# Patient Record
Sex: Female | Born: 1935 | Race: White | Hispanic: No | Marital: Married | Smoking: Former smoker
Health system: Southern US, Community
[De-identification: ages and names within clinical notes are randomized; demographics above are authoritative.]

## PROBLEM LIST (undated history)

## (undated) DIAGNOSIS — E049 Nontoxic goiter, unspecified: Secondary | ICD-10-CM

## (undated) DIAGNOSIS — J45909 Unspecified asthma, uncomplicated: Secondary | ICD-10-CM

## (undated) DIAGNOSIS — N3010 Interstitial cystitis (chronic) without hematuria: Secondary | ICD-10-CM

## (undated) DIAGNOSIS — M1990 Unspecified osteoarthritis, unspecified site: Secondary | ICD-10-CM

## (undated) DIAGNOSIS — D329 Benign neoplasm of meninges, unspecified: Secondary | ICD-10-CM

## (undated) DIAGNOSIS — M549 Dorsalgia, unspecified: Secondary | ICD-10-CM

## (undated) DIAGNOSIS — K589 Irritable bowel syndrome without diarrhea: Secondary | ICD-10-CM

## (undated) DIAGNOSIS — B029 Zoster without complications: Secondary | ICD-10-CM

## (undated) DIAGNOSIS — I10 Essential (primary) hypertension: Secondary | ICD-10-CM

## (undated) DIAGNOSIS — L900 Lichen sclerosus et atrophicus: Secondary | ICD-10-CM

## (undated) DIAGNOSIS — E785 Hyperlipidemia, unspecified: Secondary | ICD-10-CM

## (undated) DIAGNOSIS — E039 Hypothyroidism, unspecified: Secondary | ICD-10-CM

## (undated) DIAGNOSIS — R0789 Other chest pain: Secondary | ICD-10-CM

## (undated) DIAGNOSIS — I493 Ventricular premature depolarization: Secondary | ICD-10-CM

## (undated) DIAGNOSIS — M109 Gout, unspecified: Secondary | ICD-10-CM

## (undated) HISTORY — DX: Lichen sclerosus et atrophicus: L90.0

---
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
## 1988-07-26 HISTORY — PX: FINGER SURGERY: SHX640

## 1998-07-29 ENCOUNTER — Other Ambulatory Visit: Admission: RE | Admit: 1998-07-29 | Discharge: 1998-07-29 | Payer: Self-pay | Admitting: Obstetrics and Gynecology

## 1999-08-25 ENCOUNTER — Other Ambulatory Visit: Admission: RE | Admit: 1999-08-25 | Discharge: 1999-08-25 | Payer: Self-pay | Admitting: Obstetrics and Gynecology

## 2000-06-15 ENCOUNTER — Encounter (INDEPENDENT_AMBULATORY_CARE_PROVIDER_SITE_OTHER): Payer: Self-pay

## 2000-06-15 ENCOUNTER — Other Ambulatory Visit: Admission: RE | Admit: 2000-06-15 | Discharge: 2000-06-15 | Payer: Self-pay | Admitting: Obstetrics and Gynecology

## 2000-10-20 ENCOUNTER — Other Ambulatory Visit: Admission: RE | Admit: 2000-10-20 | Discharge: 2000-10-20 | Payer: Self-pay | Admitting: Obstetrics and Gynecology

## 2001-07-05 ENCOUNTER — Encounter (INDEPENDENT_AMBULATORY_CARE_PROVIDER_SITE_OTHER): Payer: Self-pay | Admitting: *Deleted

## 2001-07-05 ENCOUNTER — Ambulatory Visit (HOSPITAL_COMMUNITY): Admission: RE | Admit: 2001-07-05 | Discharge: 2001-07-05 | Payer: Self-pay | Admitting: Gastroenterology

## 2001-07-26 HISTORY — PX: CHOLECYSTECTOMY: SHX55

## 2001-08-28 ENCOUNTER — Encounter: Payer: Self-pay | Admitting: Gastroenterology

## 2001-08-28 ENCOUNTER — Ambulatory Visit (HOSPITAL_COMMUNITY): Admission: RE | Admit: 2001-08-28 | Discharge: 2001-08-28 | Payer: Self-pay | Admitting: Gastroenterology

## 2001-10-17 ENCOUNTER — Inpatient Hospital Stay (HOSPITAL_COMMUNITY): Admission: EM | Admit: 2001-10-17 | Discharge: 2001-10-19 | Payer: Self-pay | Admitting: Emergency Medicine

## 2001-10-17 ENCOUNTER — Encounter: Payer: Self-pay | Admitting: General Surgery

## 2001-10-17 ENCOUNTER — Encounter (INDEPENDENT_AMBULATORY_CARE_PROVIDER_SITE_OTHER): Payer: Self-pay | Admitting: *Deleted

## 2001-11-06 ENCOUNTER — Inpatient Hospital Stay (HOSPITAL_COMMUNITY): Admission: AD | Admit: 2001-11-06 | Discharge: 2001-11-16 | Payer: Self-pay | Admitting: General Surgery

## 2001-11-06 ENCOUNTER — Encounter: Payer: Self-pay | Admitting: Gastroenterology

## 2001-11-06 ENCOUNTER — Encounter: Admission: RE | Admit: 2001-11-06 | Discharge: 2001-11-06 | Payer: Self-pay | Admitting: Gastroenterology

## 2001-11-07 ENCOUNTER — Encounter: Payer: Self-pay | Admitting: General Surgery

## 2001-11-10 ENCOUNTER — Encounter: Payer: Self-pay | Admitting: General Surgery

## 2001-11-13 ENCOUNTER — Encounter: Payer: Self-pay | Admitting: General Surgery

## 2001-11-14 ENCOUNTER — Encounter: Payer: Self-pay | Admitting: General Surgery

## 2002-04-24 ENCOUNTER — Other Ambulatory Visit: Admission: RE | Admit: 2002-04-24 | Discharge: 2002-04-24 | Payer: Self-pay | Admitting: Obstetrics and Gynecology

## 2002-05-04 ENCOUNTER — Ambulatory Visit (HOSPITAL_COMMUNITY): Admission: RE | Admit: 2002-05-04 | Discharge: 2002-05-04 | Payer: Self-pay | Admitting: Obstetrics and Gynecology

## 2002-05-04 ENCOUNTER — Encounter (INDEPENDENT_AMBULATORY_CARE_PROVIDER_SITE_OTHER): Payer: Self-pay | Admitting: *Deleted

## 2002-07-26 HISTORY — PX: FOOT SURGERY: SHX648

## 2003-05-27 ENCOUNTER — Other Ambulatory Visit: Admission: RE | Admit: 2003-05-27 | Discharge: 2003-05-27 | Payer: Self-pay | Admitting: Obstetrics & Gynecology

## 2007-01-12 ENCOUNTER — Ambulatory Visit (HOSPITAL_COMMUNITY): Admission: RE | Admit: 2007-01-12 | Discharge: 2007-01-12 | Payer: Self-pay | Admitting: Cardiology

## 2007-01-25 ENCOUNTER — Ambulatory Visit (HOSPITAL_COMMUNITY): Admission: RE | Admit: 2007-01-25 | Discharge: 2007-01-25 | Payer: Self-pay | Admitting: Cardiology

## 2007-09-06 ENCOUNTER — Encounter: Admission: RE | Admit: 2007-09-06 | Discharge: 2007-09-06 | Payer: Self-pay | Admitting: Otolaryngology

## 2008-01-31 ENCOUNTER — Encounter: Admission: RE | Admit: 2008-01-31 | Discharge: 2008-01-31 | Payer: Self-pay | Admitting: Family Medicine

## 2008-05-16 ENCOUNTER — Encounter: Admission: RE | Admit: 2008-05-16 | Discharge: 2008-05-16 | Payer: Self-pay | Admitting: Neurosurgery

## 2010-08-16 ENCOUNTER — Encounter: Payer: Self-pay | Admitting: Interventional Cardiology

## 2010-12-08 NOTE — Cardiovascular Report (Signed)
Carla [name], Carla [name]            ACCOUNT NO.:  [id number]   MEDICAL RECORD NO.:  [id number]          PATIENT TYPE:  OIB   LOCATION:  2899                         FACILITY:  [facility]   PHYSICIAN:  Armanda Magic, M.D.     DATE OF BIRTH:  1935/12/12   DATE OF PROCEDURE:  01/25/2007  DATE OF DISCHARGE:  01/25/2007                            CARDIAC CATHETERIZATION   PROCEDURE:  Left heart catheterization, coronary angiography, left  ventriculography.   OPERATOR:  Armanda Magic, M.D.   INDICATIONS:  Chest pain and abnormal Cardiolite.   COMPLICATIONS:  None.   INTRAVENOUS ACCESS:  Via right femoral artery 6-French sheath.   This is a 75 year old white female with a history of atypical chest pain  who presented for stress Cardiolite study which was unremarkable, but  because of the patient's continued chest pain, we proceeded with a  cardiac CT.  The cardiac CT showed heavy calcification in the mid LAD,  and she now presents for cardiac catheterization.   The patient was brought to cardiac catheterization laboratory in the  fasting, nonsedated state.  Informed consent was obtained.  The patient  was connected to continuous heart rate and pulse oximetry monitoring and  intermittent blood pressure monitoring.  The right groin was prepped and  draped in a sterile fashion.  One percent Xylocaine was used for local  anesthesia.  Using modified Seldinger technique, a 6-French sheath was  placed in the right femoral artery.  Under fluoroscopic guidance, a 6-  [place] JL-4 catheter was placed in left coronary artery.  Multiple cine  films were taken in the 30-degree RAO, 40-degree LAO views.  This  catheter was then exchanged out over a guidewire for a 6-French JR-4  catheter which was placed under fluoroscopic guidance in the area of the  right coronary artery, but I could not cannulate the right coronary  artery.  The catheter was exchanged out over a guidewire for a 6-French  Noto right  coronary artery catheter which again could not engage the  right coronary ostium.  The catheter was exchanged out over a guidewire  for a 6-French angled pigtail catheter which was placed under  fluoroscopic guidance in the left ventricular cavity.  Left  ventriculography was performed in the 30-degree RAO view using a total  of 30 mL of contrast at 15 mL per second.  The catheter was then pulled  back across the aortic valve with no significant gradient noted.  Catheter was then exchanged out over a guidewire for a 6-French AL-2  catheter.  Because of what appeared to be an aberrant takeoff of the  right coronary artery, again it could not be successfully engaged.  My  colleague, Dr. Eldridge Dace, then stepped in and was able to engage the  right coronary ostium which did truly takeoff from the right coronary  cusp.  Multiple cine films were taken in the 30-degree RAO, 40-degree  LAO views.  The catheter was removed over a guidewire.  At the end of  the procedure, all catheters and sheaths were removed.  Manual  compression was performed until adequate hemostasis was obtained.  The  patient was transferred back to the room in stable condition.   RESULTS:  The left main coronary artery is widely patent and bifurcates  in the left anterior descending artery and left circumflex artery.  The  left anterior descending artery is calcified in its mid to proximal  portion.  It gives rise to a first diagonal branch.  It then gives rise  to a second diagonal branch.  Just after the take off the second  diagonal branch, there was some luminal irregularities less than 10% in  the ongoing LAD.  The first diagonal is widely patent and bifurcates  into 2 daughter blood vessels.  The second diagonal is also widely  patent but small.   The left circumflex is widely patent throughout its course in the AV  groove, giving rise to a first obtuse marginal branch which is widely  patent.  It then gives rise to a  second obtuse marginal branch which is  widely patent and bifurcates into 2 daughter branches.  The third then  terminates in a third obtuse marginal branch.   The right coronary artery is widely patent throughout its course  bifurcating distally in a posterior descending artery and posterior  lateral artery, both of which are widely patent.   Left ventriculography shows normal LV systolic function, EF 60%, left  ventricular pressure 140/3 mmHg, aortic pressure 129/57 mmHg.   ASSISTANT:  1. Normal coronary arteries.  2. Normal LV function.  3. Noncardiac chest pain.   PLAN:  Discharge to home after IV fluid bedrest.  Follow up with  physician assistant in 2 weeks for groin check.  Continue aspirin 325 mg  a day because of calcification of the left coronary artery, although  there is no significant obstructive disease.      Armanda Magic, M.D.  Electronically Signed     TT/MEDQ  D:  01/26/2007  T:  01/27/2007  Job:  [id number]   cc:   Chales Salmon. Abigail Miyamoto, M.D.

## 2010-12-11 NOTE — Discharge Summary (Signed)
[place]. [facility]  Patient:    Carla [name], Carla [name] Visit Number: [id number] MRN: [id number]          Service Type: EMS Location: [place] Attending Physician:  Lorre Nick Dictated by:   Adolph Pollack, M.D. Admit Date:  11/06/2001 Discharge Date: 11/06/2001   CC:         Anselmo Rod, M.D.  Chales Salmon. Abigail Miyamoto, M.D.   Discharge Summary  DISCHARGE DIAGNOSES:  1. Right upper quadrant subhepatic abscess (Staphylococcus aureus).  2. Severe acute on chronic gastritis.  3. Hypertension.  4. Hyperlipidemia.  5. Hypothyroidism.  6. Nephrolithiasis.  7. Chronic interstitial cystitis.  8. Mild hypokalemia.  9. Constipation. 10. Gastroesophageal reflux disease.  REASON FOR ADMISSION:  Carla [name] is a 75 year old female who underwent a laparoscopic cholecystectomy October 18, 2001, for acute cholecystitis.  She remained in the hospital on IV antibiotics for 24 hours.  A drainage catheter remained in as well.  She subsequently was discharged from the hospital.  She presented back to my office the day before her followup with me on April 1, and Dr. Orson Slick removed the drain.  Approximately five days later, she began having vomiting.  There was no abdominal pain, no fevers or chills.  She continued to have vomiting for approximately one week.  She presented to Dr. Blanca Friend office.  Dr. Elsie Amis ordered a CT scan which demonstrated a subhepatic fluid collection as well as a massively enlarged stomach with findings suggestive of gastric outlet obstruction.  She subsequently was admitted.  HOSPITAL COURSE:  She was admitted and started on IV fluids for IV fluid hydration.  White blood cell count was 10,300, which was within normal range. No elevation of liver function tests were noted and amylase and lipase were noted.  We were unable to pass a NG tube.  Subsequently, Dr. Elsie Amis performed upper endoscopy to decompress the stomach.  She noted a  dilated stomach with diffuse gastritis worse in the antrum and pyloric area.  She subsequently had to undergo CT-guided percutaneous drainage of the right upper quadrant abscess which ended up growing out methicillin-sensitive Staphylococcus aureus.  She was started on IV antibiotics at that time. Initially, she was on IV Unasyn and was switched over to IV vancomycin.  White count remained normal.  She had a mild hypokalemia which was supplemented.  I switched her over to vancomycin when we learned it was Staphylococcus aureus. She started having some fevers, but the fever slowly declined as IV Protonix was used to treat her severe gastritis.  The right upper quadrant drain continued to drain purulent material, but then the drainage started to decline.  She underwent a repeat CT scan on November 13, 2001, to reevaluate the abscess cavity and the need for drain.  There is still some inflammatory change seen.  The next day, she had removal of the drain and the catheter was collapsed.  The following day, she had the catheter removed and was switched over to oral clindamycin.  She was ambulatory now and had been switched over to a more solid type diet and was tolerating this.  Dr. Loreta Ave came by and gave her very specific instructions with respect to a diet for her reflux disease and gastritis as well as instructions for her chronic constipation.  Dr. Loreta Ave wrote these out and gave her a copy which she signed and also included a copy with the chart.  On November 16, 2001, she had been afebrile for  over 24 hours.  Her abdomen was soft.  She did have a complaint of what she felt was like a blood blister on the right breast.  When I examined this, there is a dark, reddish area measuring 8-10 mm consistent with either a blood blister or a small hemangioma.  No solid mass with noted. She is tolerating her clindamycin.  I felt that she could be discharged.  DISPOSITION:  Discharged to home on November 16, 2001.  DISCHARGE MEDICATIONS: 1. Clindamycin 150 mg p.o. t.i.d. 2. Home medications.  She has been given specific instructions by Dr. Elsie Amis    and myself.  ACTIVITY:  She was told to walk as much as possible, but not to over exert herself.  SPECIAL INSTRUCTIONS:  She was told if she had high fevers or started feeling poorly again, to call the office.  I told her about the specific side effect of diarrhea and possible C. difficile colitis with clindamycin and she knows to call me if she starts having problems with that.  FOLLOWUP:  I have arranged for her to come back and see me in the office on March 30, at 9 a.m.  CONDITION ON DISCHARGE:  She was discharged in satisfactory condition. Dictated by:   Adolph Pollack, M.D. Attending Physician:  Lorre Nick DD:  11/16/01 TD:  11/16/01 Job: [id number]

## 2010-12-11 NOTE — H&P (Signed)
[place]. [facility]  Patient:    Carla [name], Carla [name] Visit Number: [id number] MRN: [id number]          Service Type: MED Location: 1800 1827 01 Attending Physician:  Lorre Nick Dictated by:   Adolph Pollack, M.D. Admit Date:  10/17/2001                           History and Physical  REASON FOR ADMISSION:  Acute right upper quadrant pain, nausea and vomiting.  HISTORY OF PRESENT ILLNESS:  Carla [name] is a 75 year old female who awoke this morning with what she described as epigastric and right upper quadrant pain that radiated around to the right flank; she also has had protracted nausea and vomiting.  She presented to Dr. Queen Blossom office acutely ill and was sent to the emergency department for me to evaluate.  She denied fever, chills or jaundice.  She does have known cholelithiasis and I had seen her in the office, however, her symptomatology at that time was consistent with gastroesophageal reflux disease, as her symptoms were controlled with Prevacid.  She states this pain is different than her heartburn pain.  PAST MEDICAL HISTORY: 1. Hypertension. 2. Hyperlipidemia. 3. Hypothyroidism. 4. Gastroesophageal reflux disease. 5. Chronic interstitial cystitis. 6. Nephrolithiasis.  PREVIOUS OPERATION:  Tonsillectomy.  ALLERGIES:  CECLOR, ______, DARVON.  MEDICATIONS:  Triamterene hydrochlorothiazide, Altace, Synthroid, Prevacid, Celebrex.  SOCIAL HISTORY:  She is married and denies the use of tobacco.  She has two to three alcoholic beverages per week.  FAMILY HISTORY:  Remarkable for some heart disease.  REVIEW OF SYSTEMS:  PULMONARY:  No chest pain.  No coughing.  No dyspnea.  GI: No constipation or blood in the stools.  GU:  No urinary symptoms.  No history of heart disease, PID, hypercholesterolemia, diverticulitis, pancreatitis or ovarian cysts.  PHYSICAL EXAMINATION:  GENERAL:  An uncomfortable, ill-appearing female  whose temperature is 98.1, blood pressure is 150/88, pulse is 110, respiratory rate is 22.  HEENT:  Eyes:  Extraocular motions intact.  No icterus.  CARDIOVASCULAR:  Heart demonstrates an increased rate with a regular rhythm.  RESPIRATORY:  Breath sounds equal and clear and respirations unlabored.  ABDOMEN:  Soft with right upper quadrant tenderness and epigastric tenderness to palpation.  No palpable masses or organomegaly noted.  EXTREMITIES:  No cyanosis or edema.  LABORATORY AND ACCESSORY DATA:  CBC is within normal limits, as are her liver function tests.  She has a very mild elevation of her amylase but a normal lipase.  Chest x-ray demonstrates no acute lung disease.  IMPRESSION:  Right upper quadrant abdominal pain that has become more constant now.  Her symptoms are consistent with biliary colic and likely early acute cholecystitis.  PLAN:  IV fluid hydration, IV antibiotics, laparoscopic cholecystectomy.  I did explain the procedure, rationale and risks to her.  The risks include, but are not limited to, bleeding, infection, common bile duct injury, hepatic injury, gastrointestinal injury and the risk of general anesthesia.  She understands these and agrees to proceed. Dictated by:   Adolph Pollack, M.D. Attending Physician:  Lorre Nick DD:  10/17/01 TD:  10/18/01 Job: [id number]

## 2010-12-11 NOTE — Procedures (Signed)
[place]. [facility]  Patient:    Carla [name], Carla [name] Visit Number: [id number] MRN: [id number]          Service Type: MED Location: 3000 3040 01 Attending Physician:  Arlis Porta Dictated by:   Anselmo Rod, M.D. Proc. Date: 11/07/01 Admit Date:  11/06/2001   CC:         Chales Salmon. Abigail Miyamoto, M.D.  Adolph Pollack, M.D.   Procedure Report  DATE OF BIRTH:  [birth year]  PROCEDURE PERFORMED:  Esophagogastroduodenoscopy with gastric decompression.  ENDOSCOPIST:  Anselmo Rod, M.D.  INSTRUMENT USED:  Olympus video panendoscope.  INDICATIONS FOR PROCEDURE:  A 75 year old female with a history of cholecystectomy done about three weeks ago developed projectile vomiting.  CT scan of the abdomen showed a fluid collection in the right upper quadrant along with a large, dilated stomach.  Question gastric outlet obstruction. EGD is being done because an NG tube could not be passed secondary to a deviated nasal septum.  Decompression is planned and evaluation of the antrum, pylorus, and duodenal bulb will undertaken at the same time.  PREPROCEDURE PREPARATION:  Informed consent was procured from the patient. The patient was fasted for eight hours prior to the procedure.  PHYSICAL EXAMINATION:  VITAL SIGNS:  Stable.  NECK:  Supple.  CHEST:  Clear to auscultation.  CARDIAC:  S1 and S2.  ABDOMEN:  Soft with normal bowel sounds.  Well-healed surgical scar in the right upper quadrant from laparoscopic cholecystectomy.  DESCRIPTION OF PROCEDURE:  The patient was placed in the left lateral decubitus position and sedated with 55 mg of Demerol and 7 mg of Versed intravenous.  Once the patient was adequately sedated and maintained on low flow oxygen and continuous static monitoring, the Olympus video panendoscope was passed through the mouth piece, over the tongue and into the esophagus under direct vision.  The entire esophagus appeared  normal and was widely patent.  The scope was then passed into the stomach.  There was a large amount of solid debris in the high fundus.  The rest of the gastric mucosa seemed to be erythematous with more prominent changes of gastritis around the antrum. The mucosa was friable with easy bleeding.  The pylorus seemed somewhat distorted and edematous, and so did the proximal small bowel around the duodenal bulb.  The small bowel distal to that appeared normal.  There seemed to be significant inflammatory change around the duodenal bulb, but no frank ulcers were seen.  The patient tolerated the procedure well without complications.  On withdrawing the scope, a large amount of air was suctioned out of the stomach.  IMPRESSION: 1. Large amount of solid debris in the high fundus. 2. Normal-appearing esophagus. 3. Diffuse gastritis with severe changes in the antrum. 4. Edema around the pylorus and duodenal bulb, question ulcer disease. 5. Small bowel distal to the duodenum appeared normal.  RECOMMENDATIONS: 1. Proceed with physiologic drainage of the right upper quadrant. 2. The fluid collection was discussed with Dr. Abbey Chatters. 3. Further plans to be made thereafter. Dictated by:   Anselmo Rod, M.D. Attending Physician:  Arlis Porta DD:  11/07/01 TD:  11/07/01 Job: [id number]

## 2010-12-11 NOTE — Op Note (Signed)
[place]. [facility]  Patient:    Carla [name], Carla [name] Visit Number: [id number] MRN: [id number]          Service Type: MED Location: 1800 1827 01 Attending Physician:  Carla [name] Dictated by:   Carla [name], M.D. Admit Date:  10/17/2001   CC:         Carla [name], M.D.  Carla [name]. Carla [name], M.D.   Operative Report  PREOPERATIVE DIAGNOSIS:  Acute cholecystitis.  POSTOPERATIVE DIAGNOSIS:  Acute cholecystitis.  PROCEDURE:  Laparoscopic cholecystectomy.  SURGEON:  Carla [name], M.D.  ASSISTANT:  Carla [name], M.D.  ANESTHESIA:  General.  INDICATION:  [name] is a 75 year old female with known cholelithiasis but has not had classic biliary colic symptoms until this morning when she began having epigastric and right upper quadrant pain radiating around to her back, and pain became severe and persistent associated with nausea and vomiting.  No fever or chills.  Liver functions tests and CBC are normal. I examined her, and she had pain in the right upper quadrant and is now brought to the operating room.  The procedure and risks were explained to her preoperatively.  TECHNIQUE:  She was placed supine on the operating table, and general anesthetic was administered.  Her abdomen was sterilely prepped and draped. Local anesthetic consisting of dilute Marcaine was infiltrated in the subumbilical region, and a small subumbilical incision was made and carried down through the subcutaneous tissue until the fascia was exposed.  A 1-1.5 cm incision was made in the fascia.  The peritoneum was grasped and incised sharply, and the peritoneal cavity entered under direct vision.  A Hasson trocar was introduced into the peritoneal cavity, and a pneumoperitoneum was created by insufflation of CO2 gas.  Next, the laparoscope was introduced.  Upon introduction of the laparoscope, I noted multiple omental adhesions in the mid and lower  abdomen but no obvious source.  The gallbladder appeared acutely inflamed with one point of gangrenous change in the fundus of the gallbladder.  I placed her in the appropriate position and then made a small epigastric incision and inserted an 11-mm trocar through this incision into the peritoneal cavity under direct vision.  Two 5-mm incisions were made in the right abdomen, and two similar-sized trocars were placed into the abdominal cavity.  The gallbladder was distended and decompressed.  The fundus was then grasped and retracted to the right shoulder.  Filmy adhesions between the duodenum, omentum, and gallbladder were taken down sharply and bluntly.  I then took care to dissect on the gallbladder and identified an anterior cystic artery near an enlarged cystic duct lymph node, clipped this artery and divided it.  I then mobilized the infundibulum and isolated the cystic duct at its junction with the gallbladder.  I could also identify the cystic duct going into the common bile duct.  I place a clip up on the neck of the gallbladder, then three clips on the cystic duct, and divided it sharply.  The clips were occlusive.  I next identified the posterior branch of the cystic artery, clipped it, and divided it.  The gallbladder was partially intrahepatic, and I began dissecting it from the liver bed.  Once I dissected it from the liver bed, I placed it in an EndoPouch bag.  There was a large raw surface in the liver bed, and I cauterized this.  I then placed Surgicel in the gallbladder fossa.  A size 19-Blake drain was inserted into  the gallbladder fossa and brought out through the right lateral 5-mm port and anchored to the skin with 3-0 nylon suture.  It was hooked to closed suction.  The perihepatic area was copiously irrigated with saline solution.  I noticed no bile leak and no bleeding at this time.  I evacuated as much of the solution as possible.  I then removed the gallbladder  through the subumbilical port in the EndoPouch bag and then under direct vision, closed the subumbilical fascial defect by using 0 Vicryl pursestring suture.  Next, the pneumoperitoneum was released, and all the trocars removed.  The skin incisions were closed with 4-0 Monocryl subcuticular stitches.  Steri-Strips and sterile dressings were applied.  She tolerated the procedure well without any apparent complications.  She was taken to the recovery room in satisfactory condition. Dictated by:   Carla [name], M.D. Attending Physician:  Carla [name] DD:  10/17/01 TD:  10/18/01 Job: [id number]

## 2010-12-11 NOTE — Procedures (Signed)
[place]. [facility]  Patient:    Carla [name], Carla [name] Visit Number: [id number] MRN: [id number]          Service Type: END Location: ENDO Attending Physician:  Charna Elizabeth Dictated by:   Anselmo Rod, M.D. Proc. Date: 07/05/01 Admit Date:  07/05/2001                             Procedure Report  DATE OF BIRTH:  [birth year]  REFERRING PHYSICIAN:  Chales Salmon. Abigail Miyamoto, M.D.  PROCEDURE PERFORMED:  Colonoscopy.  ENDOSCOPIST:  Anselmo Rod, M.D.  INSTRUMENT USED:  Olympus video colonoscope.  INDICATIONS FOR PROCEDURE:  The patient is a 75 year old white female undergoing screening colonoscopy.  The patient was found to have guaiac positive stools on routine physical.  PREPROCEDURE PREPARATION:  Informed consent was procured from the patient. The patient was fasted for eight hours prior to the procedure and prepped with a bottle of magnesium citrate and a gallon of NuLytely the night prior to the procedure.  PREPROCEDURE PHYSICAL:  The patient had stable vital signs.  Neck supple. Chest clear to auscultation.  S1, S2 regular.  Abdomen soft with normal bowel sounds.  DESCRIPTION OF PROCEDURE:  The patient was placed in the left lateral decubitus position and sedated with an additional 40 mg of Demerol and 4 mg of Versed intravenously.  Once the patient was adequately sedated and maintained on low-flow oxygen and continuous cardiac monitoring, the Olympus video colonoscope was advanced from the rectum to the cecum without difficulty. Except for a few early left-sided diverticula no other abnormalities were seen.  The patient tolerated the procedure well without complications.  The procedure was complete up to the cecum.  The ileocecal valve and appendicular orifice were clearly visualized and photographed.  IMPRESSION:  Early left-sided diverticulosis, otherwise normal colon  RECOMMENDATIONS: 1. A high fiber diet had been recommended for the  patient. 2. Repeat guaiac testing will be done on an outpatient basis and further    recommendations made as need arises.Dictated by:   Anselmo Rod, M.D.  Attending Physician:  Charna Elizabeth DD:  07/05/01 TD:  07/05/01 Job: [id number]

## 2010-12-11 NOTE — Procedures (Signed)
[place]  Patient:    Carla [name], Carla [name] Visit Number: [id number] MRN: [id number]          Service Type: END Location: ENDO Attending Physician:  Charna Elizabeth Dictated by:   Anselmo Rod, M.D. Proc. Date: 07/05/01 Admit Date:  07/05/2001   CC:         Chales Salmon. Abigail Miyamoto, M.D.   Procedure Report  DATE OF BIRTH:  04-17-1936  REFERRING PHYSICIAN:  Chales Salmon. Abigail Miyamoto, M.D.  PROCEDURE PERFORMED:  Esophagogastroduodenoscopy with biopsies.  ENDOSCOPIST:  Anselmo Rod, M.D.  INSTRUMENT USED:  Olympus video panendoscope.  INDICATIONS FOR PROCEDURE:  The patient is a 75 year old white female with a history of severe reflux on Prevacid twice a day with guaiac positive stools. The patient has epigastric pain rule out peptic ulcer disease.  PREPROCEDURE PREPARATION:  Informed consent was procured from the patient. The patient was fasted for eight hours prior to the procedure.  PREPROCEDURE PHYSICAL:  The patient had stable vital signs.  Neck supple. Chest clear to auscultation.  S1, S2 regular.  Abdomen soft with normal abdominal bowel sounds.  DESCRIPTION OF PROCEDURE:  The patient was placed in left lateral decubitus position and sedated with 40 mg of Demerol and 4 mg of Versed intravenously. Once the patient was adequately sedated and maintained on low-flow oxygen and continuous cardiac monitoring, the Olympus video panendoscope was advanced through the mouthpiece, over the tongue, into the esophagus under direct vision.  The entire esophagus appeared normal and without lesions.  There was no evidence of Barretts mucosa, esophagitis or esophageal strictures.  The scope was then advanced to the stomach.  The entire gastric mucosa appeared healthy.  In the proximal portion of the stomach and the midbody, there were a few small sessile scattered polyps in the antrum.  Multiple biopsies were done for pathology.  No ulcers or erosions  were seen.  The proximal small bowel appeared normal as well.  There was no outlet obstruction.  The patient tolerated the procedure well without complications.  IMPRESSION: 1. Few scattered sessile gastric polyps in the antrum, biopsies done. 2. No ulcers or erosions seen. 3. No evidence of esophagitis or hiatal hernia.  RECOMMENDATION: 1. Proceed with colonoscopy at this time. 2. Continue proton pump inhibitors. 3. Await pathology results. 4. Outpatient follow-up in the next four weeks. Dictated by:   Anselmo Rod, M.D. Attending Physician:  Charna Elizabeth DD:  07/05/01 TD:  07/05/01 Job: [id number]

## 2010-12-11 NOTE — Op Note (Signed)
NAME:  [name], Carla P                      ACCOUNT NO.:  [id number]   MEDICAL RECORD NO.:  [id number]                   PATIENT TYPE:  AMB   LOCATION:  SDC                                  FACILITY:  [facility]   PHYSICIAN:  Sherry A. Rosalio Macadamia, M.D.           DATE OF BIRTH:  09-14-1935   DATE OF PROCEDURE:  05/04/2002  DATE OF DISCHARGE:                                 OPERATIVE REPORT   PREOPERATIVE DIAGNOSES:  1. Postmenopausal bleeding.  2. Cervical mass.   POSTOPERATIVE DIAGNOSES:  Submucosal fibroids.   PROCEDURE:  1. Dilatation and curettage.  2. Hysteroscopy with resection of submucosal fibroids.   SURGEON:  Sherry A. Rosalio Macadamia, M.D.   ANESTHESIA:  MAC.   INDICATIONS:  This is a 75 year old G1, P1-0-0-1 woman who has had  intermittent postmenopausal bleeding for many years.  The patient had been  on hormone replacement therapy and had been on it in a cyclical fashion  which controlled her bleeding to just cyclical bleeding.  However, she has  stopped her hormones in the past year and despite stopping her hormones, she  has had periodic bleeding.  Because of this patient had an ultrasound of a  cervical mass present.  Pelvic examination revealed approximately 1-2 cm  cervical mass.  Because of this the patient is brought to the operating room  for [facility], hysteroscopy with resectoscope.   FINDINGS:  Normal sized anteflexed uterus.  No adnexal masses.  A 2-3 cm  prolapsing submucosal fibroid and smaller submucosal fibroids in the fundus.   PROCEDURE:  The patient is brought into the operating room and given  adequate IV sedation.  She is placed in a dorsal lithotomy position.  Her  perineum was washed with Hibiclens.  Pelvic examination was performed.  The  patient is draped in a sterile fashion.  Speculum was placed within the  vagina.  Vagina is washed with Hibiclens.  Paracervical block was  administered with 1% Nesacaine.  Anterior lip of the cervix was grasped with  a single tooth tenaculum.  Picture was taken of the cervical mass.  Using a  Jacobson tenaculum the cervical mass was twisted and removed.  It was  consistent with a submucosal fibroid.  The hysteroscope was then introduced  into the endometrial cavity.  Pictures were obtained.  Multiple small  submucosal fibroids were present.  These were then resected with a double  loop resector to be able to remove the remaining irregular tissue.  Small  bleeders were cauterized.  No further abnormality could be seen.  The  hysteroscope was removed.  The tenaculum was removed.  There was no  significant bleeding from the cervix.  All instruments removed from the  vagina.  The patient was taken out of the dorsal lithotomy position.  She  was awakened.  She was moved from the operating table to a stretcher in  stable condition.   COMPLICATIONS:  None.    ESTIMATED  BLOOD LOSS:  Less than 5 cc.   FLUIDS:  Sorbitol differential -60 cc.                                               Sherry A. Rosalio Macadamia, M.D.    SAD/MEDQ  D:  05/04/2002  T:  05/04/2002  Job:  [id number]

## 2010-12-11 NOTE — H&P (Signed)
[place]. [facility]  Patient:    Carla [name], Carla [name] Visit Number: [id number] MRN: [id number]          Service Type: MED Location: 3000 [id number] Attending Physician:  Arlis Porta Dictated by:   Adolph Pollack, M.D. Admit Date:  11/06/2001   CC:         Anselmo Rod, M.D.  Chales Salmon. Abigail Miyamoto, M.D.   History and Physical  CHIEF COMPLAINT: Vomiting and early satiety.  HISTORY OF PRESENT ILLNESS: Ms. Libman is a 75 year old female, who underwent laparoscopic cholecystectomy on October 18, 2001 for acute cholecystitis.  Postoperatively a drain was left in, and was removed by Dr. Orson Slick in my office on October 24, 2001.  At that time only a very small amount of serous fluid was noted to be coming out.  She was improving and doing well until October 29, 2001.  The night before that she had got to a restaurant and eaten a fair amount of food.  Sunday evening following that she began having vomiting of food particles.  She continued to try to eat but got full fast and continued to have vomiting.  She was having small bowel movements throughout this.  She presented to Dr. Blanca Friend office today and was sent over for plain abdominal x-rays, which demonstrated what appeared to be an air fluid level in the right upper quadrant region.  Subsequently a CT scan was performed.  This demonstrated a full stomach and findings consistent with gastric outlet obstruction.  There was a fluid collection with air in it in the gallbladder fossa.  It was felt that this was extraluminal.  She says she has not had any pain.  There had been no fever or chills.  The fluid collection appears to be compressing and obstructing the distal stomach extrinsically.  PAST MEDICAL HISTORY:  1. Acute cholecystitis.  2. Hypertension.  3. Hyperlipidemia.  4. Hypothyroidism.  5. Gastroesophageal reflux.  6. Chronic interstitial cystitis.  7. Nephrolithiasis.  PAST SURGICAL  HISTORY:  1. Tonsillectomy.  2. Laparoscopic cholecystectomy.  ALLERGIES:  1. CECLOR.  2. DARVON.  MEDICATIONS:  1. Triamterene/hydrochlorothiazide.  2. Altace.  3. Synthroid.  4. Prevacid.  5. Celebrex.  SOCIAL HISTORY: She is married.  Denies use of tobacco.  She has an occasional alcoholic beverage.  REVIEW OF SYSTEMS: I asked about the character of emesis, and it is food particle.  It is not bilious.  She does feel bloated.  PHYSICAL EXAMINATION:  GENERAL: Well-developed, well-nourished female.  She appears in no acute distress.  She is pleasant and cooperative.  VITAL SIGNS: She is afebrile.  HEENT: EOMI.  No icterus.  Mucous membranes dry.  CARDIOVASCULAR: Heart demonstrates regular rate and rhythm.  RESPIRATORY: Breath sounds equal and clear.  Respirations unlabored.  ABDOMEN: Soft, nontender, nondistended.  Active bowel sounds noted. Well-healed small scars are noted.  No evidence of wound infection.  EXTREMITIES: No cyanosis or edema.  LABORATORY DATA: Amylase and lipase normal.  Liver function tests normal except for albumin of 2.7.  Chloride is 91, CO2 33, glucose 117.  WBC 10,300 with no left shift, hemoglobin 13; platelet count 492,000.  PT and PTT pending.  IMPRESSION: Gastric outlet obstruction.  May well be from extrinsic compression from a fluid collection in the gallbladder fossa.  Fluid collection is ill-defined.  Possibilities include myeloma or hematoma from delayed postoperative bleed.  PLAN:  1. IV fluid hydration.  2. Gastric decompression by way of nasogastric  tube.  3. Percutaneous drainage of this gallbladder fossa fluid collection for     characterization.  If it is hematoma then it may not be able to be drained     adequately and she may need laparoscopic drainage.  I have discussed all     this with husband and her. Dictated by:   Adolph Pollack, M.D. Attending Physician:  Arlis Porta DD:  11/06/01 TD:   11/07/01 Job: [id number]

## 2011-08-02 DIAGNOSIS — M9981 Other biomechanical lesions of cervical region: Secondary | ICD-10-CM | POA: Diagnosis not present

## 2011-08-02 DIAGNOSIS — M5430 Sciatica, unspecified side: Secondary | ICD-10-CM | POA: Diagnosis not present

## 2011-08-02 DIAGNOSIS — Z1231 Encounter for screening mammogram for malignant neoplasm of breast: Secondary | ICD-10-CM | POA: Diagnosis not present

## 2011-08-02 DIAGNOSIS — M999 Biomechanical lesion, unspecified: Secondary | ICD-10-CM | POA: Diagnosis not present

## 2011-08-02 DIAGNOSIS — M4120 Other idiopathic scoliosis, site unspecified: Secondary | ICD-10-CM | POA: Diagnosis not present

## 2011-08-27 DIAGNOSIS — M9981 Other biomechanical lesions of cervical region: Secondary | ICD-10-CM | POA: Diagnosis not present

## 2011-08-27 DIAGNOSIS — M4120 Other idiopathic scoliosis, site unspecified: Secondary | ICD-10-CM | POA: Diagnosis not present

## 2011-08-27 DIAGNOSIS — M999 Biomechanical lesion, unspecified: Secondary | ICD-10-CM | POA: Diagnosis not present

## 2011-08-27 DIAGNOSIS — M5430 Sciatica, unspecified side: Secondary | ICD-10-CM | POA: Diagnosis not present

## 2011-09-02 DIAGNOSIS — G4700 Insomnia, unspecified: Secondary | ICD-10-CM | POA: Diagnosis not present

## 2011-09-02 DIAGNOSIS — K5900 Constipation, unspecified: Secondary | ICD-10-CM | POA: Diagnosis not present

## 2011-09-02 DIAGNOSIS — N3010 Interstitial cystitis (chronic) without hematuria: Secondary | ICD-10-CM | POA: Diagnosis not present

## 2011-09-02 DIAGNOSIS — J309 Allergic rhinitis, unspecified: Secondary | ICD-10-CM | POA: Diagnosis not present

## 2011-09-03 DIAGNOSIS — M999 Biomechanical lesion, unspecified: Secondary | ICD-10-CM | POA: Diagnosis not present

## 2011-09-03 DIAGNOSIS — M9981 Other biomechanical lesions of cervical region: Secondary | ICD-10-CM | POA: Diagnosis not present

## 2011-09-03 DIAGNOSIS — M5430 Sciatica, unspecified side: Secondary | ICD-10-CM | POA: Diagnosis not present

## 2011-09-03 DIAGNOSIS — M4120 Other idiopathic scoliosis, site unspecified: Secondary | ICD-10-CM | POA: Diagnosis not present

## 2011-09-13 DIAGNOSIS — H9190 Unspecified hearing loss, unspecified ear: Secondary | ICD-10-CM | POA: Diagnosis not present

## 2011-09-13 DIAGNOSIS — R05 Cough: Secondary | ICD-10-CM | POA: Diagnosis not present

## 2011-09-13 DIAGNOSIS — R059 Cough, unspecified: Secondary | ICD-10-CM | POA: Diagnosis not present

## 2011-09-13 DIAGNOSIS — J309 Allergic rhinitis, unspecified: Secondary | ICD-10-CM | POA: Diagnosis not present

## 2011-09-13 DIAGNOSIS — R0602 Shortness of breath: Secondary | ICD-10-CM | POA: Diagnosis not present

## 2011-09-17 DIAGNOSIS — M5430 Sciatica, unspecified side: Secondary | ICD-10-CM | POA: Diagnosis not present

## 2011-09-17 DIAGNOSIS — M9981 Other biomechanical lesions of cervical region: Secondary | ICD-10-CM | POA: Diagnosis not present

## 2011-09-17 DIAGNOSIS — M4120 Other idiopathic scoliosis, site unspecified: Secondary | ICD-10-CM | POA: Diagnosis not present

## 2011-09-17 DIAGNOSIS — M999 Biomechanical lesion, unspecified: Secondary | ICD-10-CM | POA: Diagnosis not present

## 2011-09-27 DIAGNOSIS — I10 Essential (primary) hypertension: Secondary | ICD-10-CM | POA: Diagnosis not present

## 2011-09-27 DIAGNOSIS — M4120 Other idiopathic scoliosis, site unspecified: Secondary | ICD-10-CM | POA: Diagnosis not present

## 2011-09-27 DIAGNOSIS — R05 Cough: Secondary | ICD-10-CM | POA: Diagnosis not present

## 2011-09-27 DIAGNOSIS — M9981 Other biomechanical lesions of cervical region: Secondary | ICD-10-CM | POA: Diagnosis not present

## 2011-09-27 DIAGNOSIS — M5430 Sciatica, unspecified side: Secondary | ICD-10-CM | POA: Diagnosis not present

## 2011-09-27 DIAGNOSIS — R059 Cough, unspecified: Secondary | ICD-10-CM | POA: Diagnosis not present

## 2011-09-27 DIAGNOSIS — M999 Biomechanical lesion, unspecified: Secondary | ICD-10-CM | POA: Diagnosis not present

## 2011-09-29 DIAGNOSIS — M4120 Other idiopathic scoliosis, site unspecified: Secondary | ICD-10-CM | POA: Diagnosis not present

## 2011-09-29 DIAGNOSIS — M9981 Other biomechanical lesions of cervical region: Secondary | ICD-10-CM | POA: Diagnosis not present

## 2011-09-29 DIAGNOSIS — M999 Biomechanical lesion, unspecified: Secondary | ICD-10-CM | POA: Diagnosis not present

## 2011-09-29 DIAGNOSIS — M5430 Sciatica, unspecified side: Secondary | ICD-10-CM | POA: Diagnosis not present

## 2011-10-04 DIAGNOSIS — M9981 Other biomechanical lesions of cervical region: Secondary | ICD-10-CM | POA: Diagnosis not present

## 2011-10-04 DIAGNOSIS — M4120 Other idiopathic scoliosis, site unspecified: Secondary | ICD-10-CM | POA: Diagnosis not present

## 2011-10-04 DIAGNOSIS — M999 Biomechanical lesion, unspecified: Secondary | ICD-10-CM | POA: Diagnosis not present

## 2011-10-04 DIAGNOSIS — M5430 Sciatica, unspecified side: Secondary | ICD-10-CM | POA: Diagnosis not present

## 2011-10-07 DIAGNOSIS — M5430 Sciatica, unspecified side: Secondary | ICD-10-CM | POA: Diagnosis not present

## 2011-10-07 DIAGNOSIS — M999 Biomechanical lesion, unspecified: Secondary | ICD-10-CM | POA: Diagnosis not present

## 2011-10-07 DIAGNOSIS — M9981 Other biomechanical lesions of cervical region: Secondary | ICD-10-CM | POA: Diagnosis not present

## 2011-10-07 DIAGNOSIS — M4120 Other idiopathic scoliosis, site unspecified: Secondary | ICD-10-CM | POA: Diagnosis not present

## 2011-10-11 DIAGNOSIS — M4120 Other idiopathic scoliosis, site unspecified: Secondary | ICD-10-CM | POA: Diagnosis not present

## 2011-10-11 DIAGNOSIS — M5430 Sciatica, unspecified side: Secondary | ICD-10-CM | POA: Diagnosis not present

## 2011-10-11 DIAGNOSIS — M9981 Other biomechanical lesions of cervical region: Secondary | ICD-10-CM | POA: Diagnosis not present

## 2011-10-11 DIAGNOSIS — M999 Biomechanical lesion, unspecified: Secondary | ICD-10-CM | POA: Diagnosis not present

## 2011-10-14 DIAGNOSIS — H903 Sensorineural hearing loss, bilateral: Secondary | ICD-10-CM | POA: Diagnosis not present

## 2011-10-21 DIAGNOSIS — M9981 Other biomechanical lesions of cervical region: Secondary | ICD-10-CM | POA: Diagnosis not present

## 2011-10-21 DIAGNOSIS — M5430 Sciatica, unspecified side: Secondary | ICD-10-CM | POA: Diagnosis not present

## 2011-10-21 DIAGNOSIS — M999 Biomechanical lesion, unspecified: Secondary | ICD-10-CM | POA: Diagnosis not present

## 2011-10-21 DIAGNOSIS — M4120 Other idiopathic scoliosis, site unspecified: Secondary | ICD-10-CM | POA: Diagnosis not present

## 2011-10-28 DIAGNOSIS — M4120 Other idiopathic scoliosis, site unspecified: Secondary | ICD-10-CM | POA: Diagnosis not present

## 2011-10-28 DIAGNOSIS — M5430 Sciatica, unspecified side: Secondary | ICD-10-CM | POA: Diagnosis not present

## 2011-10-28 DIAGNOSIS — M9981 Other biomechanical lesions of cervical region: Secondary | ICD-10-CM | POA: Diagnosis not present

## 2011-10-28 DIAGNOSIS — M999 Biomechanical lesion, unspecified: Secondary | ICD-10-CM | POA: Diagnosis not present

## 2011-11-05 DIAGNOSIS — M5430 Sciatica, unspecified side: Secondary | ICD-10-CM | POA: Diagnosis not present

## 2011-11-05 DIAGNOSIS — M9981 Other biomechanical lesions of cervical region: Secondary | ICD-10-CM | POA: Diagnosis not present

## 2011-11-05 DIAGNOSIS — M999 Biomechanical lesion, unspecified: Secondary | ICD-10-CM | POA: Diagnosis not present

## 2011-11-05 DIAGNOSIS — M4120 Other idiopathic scoliosis, site unspecified: Secondary | ICD-10-CM | POA: Diagnosis not present

## 2011-11-10 DIAGNOSIS — R05 Cough: Secondary | ICD-10-CM | POA: Diagnosis not present

## 2011-11-10 DIAGNOSIS — J309 Allergic rhinitis, unspecified: Secondary | ICD-10-CM | POA: Diagnosis not present

## 2011-11-10 DIAGNOSIS — R059 Cough, unspecified: Secondary | ICD-10-CM | POA: Diagnosis not present

## 2011-11-10 DIAGNOSIS — J45909 Unspecified asthma, uncomplicated: Secondary | ICD-10-CM | POA: Diagnosis not present

## 2011-11-10 DIAGNOSIS — I10 Essential (primary) hypertension: Secondary | ICD-10-CM | POA: Diagnosis not present

## 2011-11-11 DIAGNOSIS — M999 Biomechanical lesion, unspecified: Secondary | ICD-10-CM | POA: Diagnosis not present

## 2011-11-11 DIAGNOSIS — M9981 Other biomechanical lesions of cervical region: Secondary | ICD-10-CM | POA: Diagnosis not present

## 2011-11-11 DIAGNOSIS — M5430 Sciatica, unspecified side: Secondary | ICD-10-CM | POA: Diagnosis not present

## 2011-11-11 DIAGNOSIS — M4120 Other idiopathic scoliosis, site unspecified: Secondary | ICD-10-CM | POA: Diagnosis not present

## 2011-11-19 DIAGNOSIS — M9981 Other biomechanical lesions of cervical region: Secondary | ICD-10-CM | POA: Diagnosis not present

## 2011-11-19 DIAGNOSIS — M4120 Other idiopathic scoliosis, site unspecified: Secondary | ICD-10-CM | POA: Diagnosis not present

## 2011-11-19 DIAGNOSIS — M999 Biomechanical lesion, unspecified: Secondary | ICD-10-CM | POA: Diagnosis not present

## 2011-11-19 DIAGNOSIS — M5430 Sciatica, unspecified side: Secondary | ICD-10-CM | POA: Diagnosis not present

## 2011-11-26 DIAGNOSIS — M999 Biomechanical lesion, unspecified: Secondary | ICD-10-CM | POA: Diagnosis not present

## 2011-11-26 DIAGNOSIS — M9981 Other biomechanical lesions of cervical region: Secondary | ICD-10-CM | POA: Diagnosis not present

## 2011-11-26 DIAGNOSIS — M4120 Other idiopathic scoliosis, site unspecified: Secondary | ICD-10-CM | POA: Diagnosis not present

## 2011-11-26 DIAGNOSIS — M5430 Sciatica, unspecified side: Secondary | ICD-10-CM | POA: Diagnosis not present

## 2011-12-07 DIAGNOSIS — H25019 Cortical age-related cataract, unspecified eye: Secondary | ICD-10-CM | POA: Diagnosis not present

## 2011-12-07 DIAGNOSIS — H2510 Age-related nuclear cataract, unspecified eye: Secondary | ICD-10-CM | POA: Diagnosis not present

## 2011-12-07 DIAGNOSIS — H43819 Vitreous degeneration, unspecified eye: Secondary | ICD-10-CM | POA: Diagnosis not present

## 2011-12-08 DIAGNOSIS — M9981 Other biomechanical lesions of cervical region: Secondary | ICD-10-CM | POA: Diagnosis not present

## 2011-12-08 DIAGNOSIS — Z124 Encounter for screening for malignant neoplasm of cervix: Secondary | ICD-10-CM | POA: Diagnosis not present

## 2011-12-08 DIAGNOSIS — Z01419 Encounter for gynecological examination (general) (routine) without abnormal findings: Secondary | ICD-10-CM | POA: Diagnosis not present

## 2011-12-08 DIAGNOSIS — M5430 Sciatica, unspecified side: Secondary | ICD-10-CM | POA: Diagnosis not present

## 2011-12-08 DIAGNOSIS — M4120 Other idiopathic scoliosis, site unspecified: Secondary | ICD-10-CM | POA: Diagnosis not present

## 2011-12-08 DIAGNOSIS — M999 Biomechanical lesion, unspecified: Secondary | ICD-10-CM | POA: Diagnosis not present

## 2011-12-08 DIAGNOSIS — Z1151 Encounter for screening for human papillomavirus (HPV): Secondary | ICD-10-CM | POA: Diagnosis not present

## 2011-12-08 DIAGNOSIS — N952 Postmenopausal atrophic vaginitis: Secondary | ICD-10-CM | POA: Diagnosis not present

## 2011-12-08 DIAGNOSIS — N393 Stress incontinence (female) (male): Secondary | ICD-10-CM | POA: Diagnosis not present

## 2011-12-13 DIAGNOSIS — J309 Allergic rhinitis, unspecified: Secondary | ICD-10-CM | POA: Diagnosis not present

## 2011-12-13 DIAGNOSIS — E785 Hyperlipidemia, unspecified: Secondary | ICD-10-CM | POA: Diagnosis not present

## 2011-12-13 DIAGNOSIS — E039 Hypothyroidism, unspecified: Secondary | ICD-10-CM | POA: Diagnosis not present

## 2011-12-13 DIAGNOSIS — N3010 Interstitial cystitis (chronic) without hematuria: Secondary | ICD-10-CM | POA: Diagnosis not present

## 2011-12-17 DIAGNOSIS — M999 Biomechanical lesion, unspecified: Secondary | ICD-10-CM | POA: Diagnosis not present

## 2011-12-17 DIAGNOSIS — M4120 Other idiopathic scoliosis, site unspecified: Secondary | ICD-10-CM | POA: Diagnosis not present

## 2011-12-17 DIAGNOSIS — M5430 Sciatica, unspecified side: Secondary | ICD-10-CM | POA: Diagnosis not present

## 2011-12-17 DIAGNOSIS — M9981 Other biomechanical lesions of cervical region: Secondary | ICD-10-CM | POA: Diagnosis not present

## 2011-12-27 DIAGNOSIS — M5430 Sciatica, unspecified side: Secondary | ICD-10-CM | POA: Diagnosis not present

## 2011-12-27 DIAGNOSIS — E039 Hypothyroidism, unspecified: Secondary | ICD-10-CM | POA: Diagnosis not present

## 2011-12-27 DIAGNOSIS — M4120 Other idiopathic scoliosis, site unspecified: Secondary | ICD-10-CM | POA: Diagnosis not present

## 2011-12-27 DIAGNOSIS — M999 Biomechanical lesion, unspecified: Secondary | ICD-10-CM | POA: Diagnosis not present

## 2011-12-27 DIAGNOSIS — E785 Hyperlipidemia, unspecified: Secondary | ICD-10-CM | POA: Diagnosis not present

## 2011-12-27 DIAGNOSIS — M9981 Other biomechanical lesions of cervical region: Secondary | ICD-10-CM | POA: Diagnosis not present

## 2011-12-27 DIAGNOSIS — I10 Essential (primary) hypertension: Secondary | ICD-10-CM | POA: Diagnosis not present

## 2012-01-10 DIAGNOSIS — M4120 Other idiopathic scoliosis, site unspecified: Secondary | ICD-10-CM | POA: Diagnosis not present

## 2012-01-10 DIAGNOSIS — M5430 Sciatica, unspecified side: Secondary | ICD-10-CM | POA: Diagnosis not present

## 2012-01-10 DIAGNOSIS — M999 Biomechanical lesion, unspecified: Secondary | ICD-10-CM | POA: Diagnosis not present

## 2012-01-10 DIAGNOSIS — M9981 Other biomechanical lesions of cervical region: Secondary | ICD-10-CM | POA: Diagnosis not present

## 2012-01-18 DIAGNOSIS — M4120 Other idiopathic scoliosis, site unspecified: Secondary | ICD-10-CM | POA: Diagnosis not present

## 2012-01-18 DIAGNOSIS — M999 Biomechanical lesion, unspecified: Secondary | ICD-10-CM | POA: Diagnosis not present

## 2012-01-18 DIAGNOSIS — M9981 Other biomechanical lesions of cervical region: Secondary | ICD-10-CM | POA: Diagnosis not present

## 2012-01-18 DIAGNOSIS — M5430 Sciatica, unspecified side: Secondary | ICD-10-CM | POA: Diagnosis not present

## 2012-02-01 DIAGNOSIS — M4120 Other idiopathic scoliosis, site unspecified: Secondary | ICD-10-CM | POA: Diagnosis not present

## 2012-02-01 DIAGNOSIS — M5430 Sciatica, unspecified side: Secondary | ICD-10-CM | POA: Diagnosis not present

## 2012-02-01 DIAGNOSIS — M9981 Other biomechanical lesions of cervical region: Secondary | ICD-10-CM | POA: Diagnosis not present

## 2012-02-01 DIAGNOSIS — M999 Biomechanical lesion, unspecified: Secondary | ICD-10-CM | POA: Diagnosis not present

## 2012-02-10 DIAGNOSIS — R05 Cough: Secondary | ICD-10-CM | POA: Diagnosis not present

## 2012-02-10 DIAGNOSIS — R0602 Shortness of breath: Secondary | ICD-10-CM | POA: Diagnosis not present

## 2012-02-10 DIAGNOSIS — J309 Allergic rhinitis, unspecified: Secondary | ICD-10-CM | POA: Diagnosis not present

## 2012-02-10 DIAGNOSIS — R059 Cough, unspecified: Secondary | ICD-10-CM | POA: Diagnosis not present

## 2012-02-11 DIAGNOSIS — M4120 Other idiopathic scoliosis, site unspecified: Secondary | ICD-10-CM | POA: Diagnosis not present

## 2012-02-11 DIAGNOSIS — M5430 Sciatica, unspecified side: Secondary | ICD-10-CM | POA: Diagnosis not present

## 2012-02-11 DIAGNOSIS — M999 Biomechanical lesion, unspecified: Secondary | ICD-10-CM | POA: Diagnosis not present

## 2012-02-11 DIAGNOSIS — M9981 Other biomechanical lesions of cervical region: Secondary | ICD-10-CM | POA: Diagnosis not present

## 2012-03-01 DIAGNOSIS — M5430 Sciatica, unspecified side: Secondary | ICD-10-CM | POA: Diagnosis not present

## 2012-03-01 DIAGNOSIS — M999 Biomechanical lesion, unspecified: Secondary | ICD-10-CM | POA: Diagnosis not present

## 2012-03-01 DIAGNOSIS — M4120 Other idiopathic scoliosis, site unspecified: Secondary | ICD-10-CM | POA: Diagnosis not present

## 2012-03-01 DIAGNOSIS — M9981 Other biomechanical lesions of cervical region: Secondary | ICD-10-CM | POA: Diagnosis not present

## 2012-03-08 DIAGNOSIS — M62838 Other muscle spasm: Secondary | ICD-10-CM | POA: Diagnosis not present

## 2012-03-08 DIAGNOSIS — M5137 Other intervertebral disc degeneration, lumbosacral region: Secondary | ICD-10-CM | POA: Diagnosis not present

## 2012-03-08 DIAGNOSIS — M999 Biomechanical lesion, unspecified: Secondary | ICD-10-CM | POA: Diagnosis not present

## 2012-03-08 DIAGNOSIS — M9981 Other biomechanical lesions of cervical region: Secondary | ICD-10-CM | POA: Diagnosis not present

## 2012-03-14 DIAGNOSIS — M4120 Other idiopathic scoliosis, site unspecified: Secondary | ICD-10-CM | POA: Diagnosis not present

## 2012-03-14 DIAGNOSIS — M999 Biomechanical lesion, unspecified: Secondary | ICD-10-CM | POA: Diagnosis not present

## 2012-03-14 DIAGNOSIS — M5430 Sciatica, unspecified side: Secondary | ICD-10-CM | POA: Diagnosis not present

## 2012-03-14 DIAGNOSIS — M9981 Other biomechanical lesions of cervical region: Secondary | ICD-10-CM | POA: Diagnosis not present

## 2012-03-31 DIAGNOSIS — M4120 Other idiopathic scoliosis, site unspecified: Secondary | ICD-10-CM | POA: Diagnosis not present

## 2012-03-31 DIAGNOSIS — M999 Biomechanical lesion, unspecified: Secondary | ICD-10-CM | POA: Diagnosis not present

## 2012-03-31 DIAGNOSIS — M5430 Sciatica, unspecified side: Secondary | ICD-10-CM | POA: Diagnosis not present

## 2012-03-31 DIAGNOSIS — M9981 Other biomechanical lesions of cervical region: Secondary | ICD-10-CM | POA: Diagnosis not present

## 2012-04-06 DIAGNOSIS — M4120 Other idiopathic scoliosis, site unspecified: Secondary | ICD-10-CM | POA: Diagnosis not present

## 2012-04-06 DIAGNOSIS — M999 Biomechanical lesion, unspecified: Secondary | ICD-10-CM | POA: Diagnosis not present

## 2012-04-06 DIAGNOSIS — M5430 Sciatica, unspecified side: Secondary | ICD-10-CM | POA: Diagnosis not present

## 2012-04-06 DIAGNOSIS — M9981 Other biomechanical lesions of cervical region: Secondary | ICD-10-CM | POA: Diagnosis not present

## 2012-04-11 DIAGNOSIS — M47817 Spondylosis without myelopathy or radiculopathy, lumbosacral region: Secondary | ICD-10-CM | POA: Diagnosis not present

## 2012-04-12 DIAGNOSIS — M5430 Sciatica, unspecified side: Secondary | ICD-10-CM | POA: Diagnosis not present

## 2012-04-12 DIAGNOSIS — M999 Biomechanical lesion, unspecified: Secondary | ICD-10-CM | POA: Diagnosis not present

## 2012-04-12 DIAGNOSIS — M4120 Other idiopathic scoliosis, site unspecified: Secondary | ICD-10-CM | POA: Diagnosis not present

## 2012-04-12 DIAGNOSIS — M9981 Other biomechanical lesions of cervical region: Secondary | ICD-10-CM | POA: Diagnosis not present

## 2012-05-15 DIAGNOSIS — M4120 Other idiopathic scoliosis, site unspecified: Secondary | ICD-10-CM | POA: Diagnosis not present

## 2012-05-15 DIAGNOSIS — M9981 Other biomechanical lesions of cervical region: Secondary | ICD-10-CM | POA: Diagnosis not present

## 2012-05-15 DIAGNOSIS — M5430 Sciatica, unspecified side: Secondary | ICD-10-CM | POA: Diagnosis not present

## 2012-05-15 DIAGNOSIS — M999 Biomechanical lesion, unspecified: Secondary | ICD-10-CM | POA: Diagnosis not present

## 2012-05-17 DIAGNOSIS — M5430 Sciatica, unspecified side: Secondary | ICD-10-CM | POA: Diagnosis not present

## 2012-05-17 DIAGNOSIS — M999 Biomechanical lesion, unspecified: Secondary | ICD-10-CM | POA: Diagnosis not present

## 2012-05-17 DIAGNOSIS — M9981 Other biomechanical lesions of cervical region: Secondary | ICD-10-CM | POA: Diagnosis not present

## 2012-05-17 DIAGNOSIS — M4120 Other idiopathic scoliosis, site unspecified: Secondary | ICD-10-CM | POA: Diagnosis not present

## 2012-05-23 DIAGNOSIS — M4120 Other idiopathic scoliosis, site unspecified: Secondary | ICD-10-CM | POA: Diagnosis not present

## 2012-05-23 DIAGNOSIS — M9981 Other biomechanical lesions of cervical region: Secondary | ICD-10-CM | POA: Diagnosis not present

## 2012-05-23 DIAGNOSIS — M999 Biomechanical lesion, unspecified: Secondary | ICD-10-CM | POA: Diagnosis not present

## 2012-05-23 DIAGNOSIS — M5430 Sciatica, unspecified side: Secondary | ICD-10-CM | POA: Diagnosis not present

## 2012-05-26 DIAGNOSIS — M4120 Other idiopathic scoliosis, site unspecified: Secondary | ICD-10-CM | POA: Diagnosis not present

## 2012-05-26 DIAGNOSIS — M9981 Other biomechanical lesions of cervical region: Secondary | ICD-10-CM | POA: Diagnosis not present

## 2012-05-26 DIAGNOSIS — M5430 Sciatica, unspecified side: Secondary | ICD-10-CM | POA: Diagnosis not present

## 2012-05-26 DIAGNOSIS — M999 Biomechanical lesion, unspecified: Secondary | ICD-10-CM | POA: Diagnosis not present

## 2012-05-31 DIAGNOSIS — M999 Biomechanical lesion, unspecified: Secondary | ICD-10-CM | POA: Diagnosis not present

## 2012-05-31 DIAGNOSIS — M5430 Sciatica, unspecified side: Secondary | ICD-10-CM | POA: Diagnosis not present

## 2012-05-31 DIAGNOSIS — M9981 Other biomechanical lesions of cervical region: Secondary | ICD-10-CM | POA: Diagnosis not present

## 2012-05-31 DIAGNOSIS — M4120 Other idiopathic scoliosis, site unspecified: Secondary | ICD-10-CM | POA: Diagnosis not present

## 2012-06-14 DIAGNOSIS — E785 Hyperlipidemia, unspecified: Secondary | ICD-10-CM | POA: Diagnosis not present

## 2012-06-14 DIAGNOSIS — I10 Essential (primary) hypertension: Secondary | ICD-10-CM | POA: Diagnosis not present

## 2012-06-14 DIAGNOSIS — Z23 Encounter for immunization: Secondary | ICD-10-CM | POA: Diagnosis not present

## 2012-06-14 DIAGNOSIS — J45909 Unspecified asthma, uncomplicated: Secondary | ICD-10-CM | POA: Diagnosis not present

## 2012-06-14 DIAGNOSIS — E039 Hypothyroidism, unspecified: Secondary | ICD-10-CM | POA: Diagnosis not present

## 2012-06-14 DIAGNOSIS — Z1331 Encounter for screening for depression: Secondary | ICD-10-CM | POA: Diagnosis not present

## 2012-06-27 DIAGNOSIS — M62838 Other muscle spasm: Secondary | ICD-10-CM | POA: Diagnosis not present

## 2012-06-27 DIAGNOSIS — M999 Biomechanical lesion, unspecified: Secondary | ICD-10-CM | POA: Diagnosis not present

## 2012-07-03 DIAGNOSIS — M999 Biomechanical lesion, unspecified: Secondary | ICD-10-CM | POA: Diagnosis not present

## 2012-07-03 DIAGNOSIS — M62838 Other muscle spasm: Secondary | ICD-10-CM | POA: Diagnosis not present

## 2012-07-10 DIAGNOSIS — M999 Biomechanical lesion, unspecified: Secondary | ICD-10-CM | POA: Diagnosis not present

## 2012-07-10 DIAGNOSIS — M62838 Other muscle spasm: Secondary | ICD-10-CM | POA: Diagnosis not present

## 2012-08-01 DIAGNOSIS — M999 Biomechanical lesion, unspecified: Secondary | ICD-10-CM | POA: Diagnosis not present

## 2012-08-01 DIAGNOSIS — M62838 Other muscle spasm: Secondary | ICD-10-CM | POA: Diagnosis not present

## 2012-09-25 DIAGNOSIS — J0100 Acute maxillary sinusitis, unspecified: Secondary | ICD-10-CM | POA: Diagnosis not present

## 2012-09-25 DIAGNOSIS — J45909 Unspecified asthma, uncomplicated: Secondary | ICD-10-CM | POA: Diagnosis not present

## 2012-09-25 DIAGNOSIS — R062 Wheezing: Secondary | ICD-10-CM | POA: Diagnosis not present

## 2012-09-25 DIAGNOSIS — R059 Cough, unspecified: Secondary | ICD-10-CM | POA: Diagnosis not present

## 2012-09-25 DIAGNOSIS — K589 Irritable bowel syndrome without diarrhea: Secondary | ICD-10-CM | POA: Diagnosis not present

## 2012-09-25 DIAGNOSIS — R05 Cough: Secondary | ICD-10-CM | POA: Diagnosis not present

## 2012-10-09 DIAGNOSIS — K589 Irritable bowel syndrome without diarrhea: Secondary | ICD-10-CM | POA: Diagnosis not present

## 2012-10-09 DIAGNOSIS — J45909 Unspecified asthma, uncomplicated: Secondary | ICD-10-CM | POA: Diagnosis not present

## 2012-10-09 DIAGNOSIS — J309 Allergic rhinitis, unspecified: Secondary | ICD-10-CM | POA: Diagnosis not present

## 2012-10-09 DIAGNOSIS — I10 Essential (primary) hypertension: Secondary | ICD-10-CM | POA: Diagnosis not present

## 2012-10-09 DIAGNOSIS — K5900 Constipation, unspecified: Secondary | ICD-10-CM | POA: Diagnosis not present

## 2012-10-09 DIAGNOSIS — R059 Cough, unspecified: Secondary | ICD-10-CM | POA: Diagnosis not present

## 2012-10-27 DIAGNOSIS — M62838 Other muscle spasm: Secondary | ICD-10-CM | POA: Diagnosis not present

## 2012-10-27 DIAGNOSIS — M999 Biomechanical lesion, unspecified: Secondary | ICD-10-CM | POA: Diagnosis not present

## 2012-10-30 DIAGNOSIS — M999 Biomechanical lesion, unspecified: Secondary | ICD-10-CM | POA: Diagnosis not present

## 2012-10-30 DIAGNOSIS — M62838 Other muscle spasm: Secondary | ICD-10-CM | POA: Diagnosis not present

## 2012-11-02 DIAGNOSIS — M545 Low back pain, unspecified: Secondary | ICD-10-CM | POA: Diagnosis not present

## 2012-11-02 DIAGNOSIS — M5137 Other intervertebral disc degeneration, lumbosacral region: Secondary | ICD-10-CM | POA: Diagnosis not present

## 2012-11-28 DIAGNOSIS — M47817 Spondylosis without myelopathy or radiculopathy, lumbosacral region: Secondary | ICD-10-CM | POA: Diagnosis not present

## 2012-12-04 DIAGNOSIS — M47817 Spondylosis without myelopathy or radiculopathy, lumbosacral region: Secondary | ICD-10-CM | POA: Diagnosis not present

## 2012-12-05 DIAGNOSIS — Z1231 Encounter for screening mammogram for malignant neoplasm of breast: Secondary | ICD-10-CM | POA: Diagnosis not present

## 2012-12-08 DIAGNOSIS — R82998 Other abnormal findings in urine: Secondary | ICD-10-CM | POA: Diagnosis not present

## 2012-12-08 DIAGNOSIS — E785 Hyperlipidemia, unspecified: Secondary | ICD-10-CM | POA: Diagnosis not present

## 2012-12-08 DIAGNOSIS — I10 Essential (primary) hypertension: Secondary | ICD-10-CM | POA: Diagnosis not present

## 2012-12-08 DIAGNOSIS — E039 Hypothyroidism, unspecified: Secondary | ICD-10-CM | POA: Diagnosis not present

## 2012-12-12 DIAGNOSIS — M545 Low back pain, unspecified: Secondary | ICD-10-CM | POA: Diagnosis not present

## 2012-12-12 DIAGNOSIS — M999 Biomechanical lesion, unspecified: Secondary | ICD-10-CM | POA: Diagnosis not present

## 2012-12-12 DIAGNOSIS — M62838 Other muscle spasm: Secondary | ICD-10-CM | POA: Diagnosis not present

## 2012-12-13 DIAGNOSIS — M5137 Other intervertebral disc degeneration, lumbosacral region: Secondary | ICD-10-CM | POA: Diagnosis not present

## 2012-12-14 DIAGNOSIS — Z0000 Encounter for general adult medical examination without abnormal findings: Secondary | ICD-10-CM | POA: Diagnosis not present

## 2012-12-14 DIAGNOSIS — N3010 Interstitial cystitis (chronic) without hematuria: Secondary | ICD-10-CM | POA: Diagnosis not present

## 2012-12-14 DIAGNOSIS — G4700 Insomnia, unspecified: Secondary | ICD-10-CM | POA: Diagnosis not present

## 2012-12-14 DIAGNOSIS — K589 Irritable bowel syndrome without diarrhea: Secondary | ICD-10-CM | POA: Diagnosis not present

## 2012-12-14 DIAGNOSIS — E559 Vitamin D deficiency, unspecified: Secondary | ICD-10-CM | POA: Diagnosis not present

## 2012-12-14 DIAGNOSIS — M4120 Other idiopathic scoliosis, site unspecified: Secondary | ICD-10-CM | POA: Diagnosis not present

## 2012-12-14 DIAGNOSIS — J45909 Unspecified asthma, uncomplicated: Secondary | ICD-10-CM | POA: Diagnosis not present

## 2012-12-14 DIAGNOSIS — I499 Cardiac arrhythmia, unspecified: Secondary | ICD-10-CM | POA: Diagnosis not present

## 2012-12-14 DIAGNOSIS — J309 Allergic rhinitis, unspecified: Secondary | ICD-10-CM | POA: Diagnosis not present

## 2012-12-14 DIAGNOSIS — E039 Hypothyroidism, unspecified: Secondary | ICD-10-CM | POA: Diagnosis not present

## 2012-12-19 DIAGNOSIS — M62838 Other muscle spasm: Secondary | ICD-10-CM | POA: Diagnosis not present

## 2012-12-19 DIAGNOSIS — M999 Biomechanical lesion, unspecified: Secondary | ICD-10-CM | POA: Diagnosis not present

## 2012-12-20 DIAGNOSIS — M5137 Other intervertebral disc degeneration, lumbosacral region: Secondary | ICD-10-CM | POA: Diagnosis not present

## 2012-12-21 DIAGNOSIS — R32 Unspecified urinary incontinence: Secondary | ICD-10-CM | POA: Diagnosis not present

## 2012-12-21 DIAGNOSIS — N9089 Other specified noninflammatory disorders of vulva and perineum: Secondary | ICD-10-CM | POA: Diagnosis not present

## 2012-12-21 DIAGNOSIS — Z01419 Encounter for gynecological examination (general) (routine) without abnormal findings: Secondary | ICD-10-CM | POA: Diagnosis not present

## 2012-12-21 DIAGNOSIS — Z124 Encounter for screening for malignant neoplasm of cervix: Secondary | ICD-10-CM | POA: Diagnosis not present

## 2012-12-21 DIAGNOSIS — N8111 Cystocele, midline: Secondary | ICD-10-CM | POA: Diagnosis not present

## 2012-12-21 DIAGNOSIS — N952 Postmenopausal atrophic vaginitis: Secondary | ICD-10-CM | POA: Diagnosis not present

## 2012-12-26 DIAGNOSIS — M62838 Other muscle spasm: Secondary | ICD-10-CM | POA: Diagnosis not present

## 2012-12-26 DIAGNOSIS — M999 Biomechanical lesion, unspecified: Secondary | ICD-10-CM | POA: Diagnosis not present

## 2012-12-27 DIAGNOSIS — M5137 Other intervertebral disc degeneration, lumbosacral region: Secondary | ICD-10-CM | POA: Diagnosis not present

## 2013-01-01 DIAGNOSIS — M999 Biomechanical lesion, unspecified: Secondary | ICD-10-CM | POA: Diagnosis not present

## 2013-01-01 DIAGNOSIS — M5137 Other intervertebral disc degeneration, lumbosacral region: Secondary | ICD-10-CM | POA: Diagnosis not present

## 2013-01-01 DIAGNOSIS — M62838 Other muscle spasm: Secondary | ICD-10-CM | POA: Diagnosis not present

## 2013-01-08 DIAGNOSIS — M5137 Other intervertebral disc degeneration, lumbosacral region: Secondary | ICD-10-CM | POA: Diagnosis not present

## 2013-01-25 DIAGNOSIS — Z6832 Body mass index (BMI) 32.0-32.9, adult: Secondary | ICD-10-CM | POA: Diagnosis not present

## 2013-01-25 DIAGNOSIS — R05 Cough: Secondary | ICD-10-CM | POA: Diagnosis not present

## 2013-01-25 DIAGNOSIS — J0100 Acute maxillary sinusitis, unspecified: Secondary | ICD-10-CM | POA: Diagnosis not present

## 2013-01-25 DIAGNOSIS — R059 Cough, unspecified: Secondary | ICD-10-CM | POA: Diagnosis not present

## 2013-02-05 DIAGNOSIS — J309 Allergic rhinitis, unspecified: Secondary | ICD-10-CM | POA: Diagnosis not present

## 2013-02-05 DIAGNOSIS — J45909 Unspecified asthma, uncomplicated: Secondary | ICD-10-CM | POA: Diagnosis not present

## 2013-02-05 DIAGNOSIS — Z6832 Body mass index (BMI) 32.0-32.9, adult: Secondary | ICD-10-CM | POA: Diagnosis not present

## 2013-02-05 DIAGNOSIS — K219 Gastro-esophageal reflux disease without esophagitis: Secondary | ICD-10-CM | POA: Diagnosis not present

## 2013-02-06 DIAGNOSIS — M5137 Other intervertebral disc degeneration, lumbosacral region: Secondary | ICD-10-CM | POA: Diagnosis not present

## 2013-02-13 DIAGNOSIS — M5137 Other intervertebral disc degeneration, lumbosacral region: Secondary | ICD-10-CM | POA: Diagnosis not present

## 2013-02-14 DIAGNOSIS — M47817 Spondylosis without myelopathy or radiculopathy, lumbosacral region: Secondary | ICD-10-CM | POA: Diagnosis not present

## 2013-02-14 DIAGNOSIS — M5137 Other intervertebral disc degeneration, lumbosacral region: Secondary | ICD-10-CM | POA: Diagnosis not present

## 2013-02-20 DIAGNOSIS — M545 Low back pain, unspecified: Secondary | ICD-10-CM | POA: Diagnosis not present

## 2013-04-10 DIAGNOSIS — N952 Postmenopausal atrophic vaginitis: Secondary | ICD-10-CM | POA: Diagnosis not present

## 2013-04-10 DIAGNOSIS — L293 Anogenital pruritus, unspecified: Secondary | ICD-10-CM | POA: Diagnosis not present

## 2013-04-10 DIAGNOSIS — Z23 Encounter for immunization: Secondary | ICD-10-CM | POA: Diagnosis not present

## 2013-04-17 DIAGNOSIS — S59909A Unspecified injury of unspecified elbow, initial encounter: Secondary | ICD-10-CM | POA: Diagnosis not present

## 2013-04-17 DIAGNOSIS — M4120 Other idiopathic scoliosis, site unspecified: Secondary | ICD-10-CM | POA: Diagnosis not present

## 2013-04-17 DIAGNOSIS — M79609 Pain in unspecified limb: Secondary | ICD-10-CM | POA: Diagnosis not present

## 2013-04-17 DIAGNOSIS — M1710 Unilateral primary osteoarthritis, unspecified knee: Secondary | ICD-10-CM | POA: Diagnosis not present

## 2013-04-17 DIAGNOSIS — M948X9 Other specified disorders of cartilage, unspecified sites: Secondary | ICD-10-CM | POA: Diagnosis not present

## 2013-04-17 DIAGNOSIS — I10 Essential (primary) hypertension: Secondary | ICD-10-CM | POA: Diagnosis not present

## 2013-04-17 DIAGNOSIS — S8990XA Unspecified injury of unspecified lower leg, initial encounter: Secondary | ICD-10-CM | POA: Diagnosis not present

## 2013-04-17 DIAGNOSIS — IMO0002 Reserved for concepts with insufficient information to code with codable children: Secondary | ICD-10-CM | POA: Diagnosis not present

## 2013-04-19 DIAGNOSIS — M25519 Pain in unspecified shoulder: Secondary | ICD-10-CM | POA: Diagnosis not present

## 2013-04-19 DIAGNOSIS — M19039 Primary osteoarthritis, unspecified wrist: Secondary | ICD-10-CM | POA: Diagnosis not present

## 2013-05-11 DIAGNOSIS — M25519 Pain in unspecified shoulder: Secondary | ICD-10-CM | POA: Diagnosis not present

## 2013-06-14 DIAGNOSIS — E785 Hyperlipidemia, unspecified: Secondary | ICD-10-CM | POA: Diagnosis not present

## 2013-06-14 DIAGNOSIS — Z6833 Body mass index (BMI) 33.0-33.9, adult: Secondary | ICD-10-CM | POA: Diagnosis not present

## 2013-06-14 DIAGNOSIS — K589 Irritable bowel syndrome without diarrhea: Secondary | ICD-10-CM | POA: Diagnosis not present

## 2013-06-14 DIAGNOSIS — J45909 Unspecified asthma, uncomplicated: Secondary | ICD-10-CM | POA: Diagnosis not present

## 2013-06-14 DIAGNOSIS — K219 Gastro-esophageal reflux disease without esophagitis: Secondary | ICD-10-CM | POA: Diagnosis not present

## 2013-06-14 DIAGNOSIS — R252 Cramp and spasm: Secondary | ICD-10-CM | POA: Diagnosis not present

## 2013-08-28 DIAGNOSIS — J309 Allergic rhinitis, unspecified: Secondary | ICD-10-CM | POA: Diagnosis not present

## 2013-08-28 DIAGNOSIS — R059 Cough, unspecified: Secondary | ICD-10-CM | POA: Diagnosis not present

## 2013-08-28 DIAGNOSIS — J0100 Acute maxillary sinusitis, unspecified: Secondary | ICD-10-CM | POA: Diagnosis not present

## 2013-08-28 DIAGNOSIS — J45909 Unspecified asthma, uncomplicated: Secondary | ICD-10-CM | POA: Diagnosis not present

## 2013-08-28 DIAGNOSIS — R05 Cough: Secondary | ICD-10-CM | POA: Diagnosis not present

## 2013-08-28 DIAGNOSIS — I10 Essential (primary) hypertension: Secondary | ICD-10-CM | POA: Diagnosis not present

## 2013-08-28 DIAGNOSIS — Z6831 Body mass index (BMI) 31.0-31.9, adult: Secondary | ICD-10-CM | POA: Diagnosis not present

## 2013-10-08 DIAGNOSIS — R059 Cough, unspecified: Secondary | ICD-10-CM | POA: Diagnosis not present

## 2013-10-08 DIAGNOSIS — R062 Wheezing: Secondary | ICD-10-CM | POA: Diagnosis not present

## 2013-10-08 DIAGNOSIS — R05 Cough: Secondary | ICD-10-CM | POA: Diagnosis not present

## 2013-10-08 DIAGNOSIS — R509 Fever, unspecified: Secondary | ICD-10-CM | POA: Diagnosis not present

## 2013-10-08 DIAGNOSIS — H1030 Unspecified acute conjunctivitis, unspecified eye: Secondary | ICD-10-CM | POA: Diagnosis not present

## 2013-10-08 DIAGNOSIS — J209 Acute bronchitis, unspecified: Secondary | ICD-10-CM | POA: Diagnosis not present

## 2013-10-08 DIAGNOSIS — J45909 Unspecified asthma, uncomplicated: Secondary | ICD-10-CM | POA: Diagnosis not present

## 2013-10-08 DIAGNOSIS — I10 Essential (primary) hypertension: Secondary | ICD-10-CM | POA: Diagnosis not present

## 2013-10-08 DIAGNOSIS — J309 Allergic rhinitis, unspecified: Secondary | ICD-10-CM | POA: Diagnosis not present

## 2013-10-12 ENCOUNTER — Observation Stay (HOSPITAL_COMMUNITY)
Admission: EM | Admit: 2013-10-12 | Discharge: 2013-10-13 | Disposition: A | Discharge status: Home / Self Care | Payer: Medicare Other | Attending: Internal Medicine | Admitting: Internal Medicine

## 2013-10-12 ENCOUNTER — Observation Stay (HOSPITAL_COMMUNITY): Payer: Medicare Other

## 2013-10-12 ENCOUNTER — Encounter (HOSPITAL_COMMUNITY): Payer: Self-pay | Admitting: Emergency Medicine

## 2013-10-12 DIAGNOSIS — R42 Dizziness and giddiness: Secondary | ICD-10-CM | POA: Diagnosis present

## 2013-10-12 DIAGNOSIS — H6120 Impacted cerumen, unspecified ear: Secondary | ICD-10-CM | POA: Diagnosis not present

## 2013-10-12 DIAGNOSIS — R5383 Other fatigue: Secondary | ICD-10-CM | POA: Insufficient documentation

## 2013-10-12 DIAGNOSIS — Z888 Allergy status to other drugs, medicaments and biological substances status: Secondary | ICD-10-CM | POA: Diagnosis not present

## 2013-10-12 DIAGNOSIS — D320 Benign neoplasm of cerebral meninges: Secondary | ICD-10-CM | POA: Diagnosis not present

## 2013-10-12 DIAGNOSIS — Z881 Allergy status to other antibiotic agents status: Secondary | ICD-10-CM | POA: Diagnosis not present

## 2013-10-12 DIAGNOSIS — J3489 Other specified disorders of nose and nasal sinuses: Secondary | ICD-10-CM | POA: Insufficient documentation

## 2013-10-12 DIAGNOSIS — I959 Hypotension, unspecified: Secondary | ICD-10-CM | POA: Diagnosis not present

## 2013-10-12 DIAGNOSIS — I493 Ventricular premature depolarization: Secondary | ICD-10-CM | POA: Diagnosis present

## 2013-10-12 DIAGNOSIS — R55 Syncope and collapse: Principal | ICD-10-CM | POA: Insufficient documentation

## 2013-10-12 DIAGNOSIS — R1032 Left lower quadrant pain: Secondary | ICD-10-CM | POA: Diagnosis not present

## 2013-10-12 DIAGNOSIS — I4949 Other premature depolarization: Secondary | ICD-10-CM | POA: Diagnosis not present

## 2013-10-12 DIAGNOSIS — K589 Irritable bowel syndrome without diarrhea: Secondary | ICD-10-CM | POA: Diagnosis present

## 2013-10-12 DIAGNOSIS — N179 Acute kidney failure, unspecified: Secondary | ICD-10-CM | POA: Diagnosis present

## 2013-10-12 DIAGNOSIS — R5381 Other malaise: Secondary | ICD-10-CM | POA: Diagnosis not present

## 2013-10-12 DIAGNOSIS — Z885 Allergy status to narcotic agent status: Secondary | ICD-10-CM | POA: Insufficient documentation

## 2013-10-12 DIAGNOSIS — J9819 Other pulmonary collapse: Secondary | ICD-10-CM | POA: Diagnosis not present

## 2013-10-12 DIAGNOSIS — D329 Benign neoplasm of meninges, unspecified: Secondary | ICD-10-CM

## 2013-10-12 DIAGNOSIS — R404 Transient alteration of awareness: Secondary | ICD-10-CM | POA: Diagnosis not present

## 2013-10-12 HISTORY — DX: Unspecified osteoarthritis, unspecified site: M19.90

## 2013-10-12 HISTORY — DX: Interstitial cystitis (chronic) without hematuria: N30.10

## 2013-10-12 HISTORY — DX: Unspecified asthma, uncomplicated: J45.909

## 2013-10-12 HISTORY — DX: Essential (primary) hypertension: I10

## 2013-10-12 HISTORY — DX: Irritable bowel syndrome, unspecified: K58.9

## 2013-10-12 LAB — BASIC METABOLIC PANEL
BUN: 39 mg/dL — ABNORMAL HIGH (ref 6–23)
CO2: 22 mEq/L (ref 19–32)
Calcium: 9 mg/dL (ref 8.4–10.5)
Chloride: 104 mEq/L (ref 96–112)
Creatinine, Ser: 1.65 mg/dL — ABNORMAL HIGH (ref 0.50–1.10)
GFR calc Af Amer: 34 mL/min — ABNORMAL LOW (ref >90)
GFR calc non Af Amer: 29 mL/min — ABNORMAL LOW (ref >90)
Glucose, Bld: 87 mg/dL (ref 70–99)
Potassium: 3.9 mEq/L (ref 3.7–5.3)
Sodium: 141 mEq/L (ref 137–147)

## 2013-10-12 LAB — URINALYSIS, ROUTINE W REFLEX MICROSCOPIC
Bilirubin Urine: NEGATIVE
Glucose, UA: NEGATIVE mg/dL
Hgb urine dipstick: NEGATIVE
Ketones, ur: NEGATIVE mg/dL
Leukocytes, UA: NEGATIVE
Nitrite: NEGATIVE
Protein, ur: NEGATIVE mg/dL
Specific Gravity, Urine: 1.007 (ref 1.005–1.030)
Urobilinogen, UA: 0.2 mg/dL (ref 0.0–1.0)
pH: 6.5 (ref 5.0–8.0)

## 2013-10-12 LAB — HEPATIC FUNCTION PANEL
ALT: 28 U/L (ref 0–35)
AST: 24 U/L (ref 0–37)
Albumin: 3.1 g/dL — ABNORMAL LOW (ref 3.5–5.2)
Alkaline Phosphatase: 63 U/L (ref 39–117)
Bilirubin, Direct: <0.2 mg/dL (ref 0.0–0.3)
Total Bilirubin: <0.2 mg/dL — ABNORMAL LOW (ref 0.3–1.2)
Total Protein: 6.7 g/dL (ref 6.0–8.3)

## 2013-10-12 LAB — CBC
HCT: 38.7 % (ref 36.0–46.0)
Hemoglobin: 13.1 g/dL (ref 12.0–15.0)
MCH: 29.8 pg (ref 26.0–34.0)
MCHC: 33.9 g/dL (ref 30.0–36.0)
MCV: 88.2 fL (ref 78.0–100.0)
Platelets: 267 10*3/uL (ref 150–400)
RBC: 4.39 MIL/uL (ref 3.87–5.11)
RDW: 13.1 % (ref 11.5–15.5)
WBC: 10 10*3/uL (ref 4.0–10.5)

## 2013-10-12 LAB — PROTIME-INR
INR: 0.96 (ref 0.00–1.49)
Prothrombin Time: 12.6 seconds (ref 11.6–15.2)

## 2013-10-12 LAB — I-STAT TROPONIN, ED: Troponin i, poc: 0 ng/mL (ref 0.00–0.08)

## 2013-10-12 LAB — MAGNESIUM: Magnesium: 1.6 mg/dL (ref 1.5–2.5)

## 2013-10-12 LAB — CBG MONITORING, ED
Glucose-Capillary: 66 mg/dL — ABNORMAL LOW (ref 70–99)
Glucose-Capillary: 90 mg/dL (ref 70–99)

## 2013-10-12 LAB — TROPONIN I: Troponin I: <0.3 ng/mL (ref <0.30)

## 2013-10-12 MED ORDER — HYDROCODONE-HOMATROPINE 5-1.5 MG/5ML PO SYRP: 5.0000 mL | ORAL_SOLUTION | Freq: Four times a day (QID) | ORAL | Status: DC | PRN

## 2013-10-12 MED ORDER — HYDROXYZINE HCL 25 MG PO TABS
25.0000 mg | ORAL_TABLET | Freq: Every day | ORAL | Status: DC
  Administered 2013-10-13: 25 mg via ORAL
  Filled 2013-10-12: qty 1

## 2013-10-12 MED ORDER — FLUTICASONE PROPIONATE 50 MCG/ACT NA SUSP
1.0000 | Freq: Every day | NASAL | Status: DC
  Filled 2013-10-12: qty 16

## 2013-10-12 MED ORDER — SODIUM CHLORIDE 0.9 % IV BOLUS (SEPSIS)
1000.0000 mL | Freq: Once | INTRAVENOUS | Status: AC
  Administered 2013-10-12: 1000 mL via INTRAVENOUS

## 2013-10-12 MED ORDER — SIMVASTATIN 40 MG PO TABS
40.0000 mg | ORAL_TABLET | Freq: Every day | ORAL | Status: DC
  Administered 2013-10-13: 40 mg via ORAL
  Filled 2013-10-12 (×2): qty 1

## 2013-10-12 MED ORDER — AMITRIPTYLINE HCL 25 MG PO TABS
25.0000 mg | ORAL_TABLET | Freq: Every day | ORAL | Status: DC
  Filled 2013-10-12: qty 1

## 2013-10-12 MED ORDER — SODIUM CHLORIDE 0.9 % IJ SOLN
3.0000 mL | Freq: Two times a day (BID) | INTRAMUSCULAR | Status: DC
  Administered 2013-10-13: 3 mL via INTRAVENOUS

## 2013-10-12 MED ORDER — BUDESONIDE-FORMOTEROL FUMARATE 160-4.5 MCG/ACT IN AERO
2.0000 | INHALATION_SPRAY | Freq: Two times a day (BID) | RESPIRATORY_TRACT | Status: DC
  Administered 2013-10-13: 2 via RESPIRATORY_TRACT
  Filled 2013-10-12: qty 6

## 2013-10-12 MED ORDER — ONDANSETRON HCL 4 MG/2ML IJ SOLN: 4.0000 mg | Freq: Four times a day (QID) | INTRAMUSCULAR | Status: DC | PRN

## 2013-10-12 MED ORDER — MONTELUKAST SODIUM 10 MG PO TABS
10.0000 mg | ORAL_TABLET | Freq: Every day | ORAL | Status: DC
  Administered 2013-10-13: 10 mg via ORAL
  Filled 2013-10-12 (×2): qty 1

## 2013-10-12 MED ORDER — SODIUM CHLORIDE 0.9 % IV SOLN
INTRAVENOUS | Status: DC
  Administered 2013-10-13: 01:00:00 via INTRAVENOUS

## 2013-10-12 MED ORDER — LINACLOTIDE 145 MCG PO CAPS
145.0000 ug | ORAL_CAPSULE | Freq: Every day | ORAL | Status: DC
  Filled 2013-10-12 (×2): qty 1

## 2013-10-12 MED ORDER — PENTOSAN POLYSULFATE SODIUM 100 MG PO CAPS
100.0000 mg | ORAL_CAPSULE | Freq: Three times a day (TID) | ORAL | Status: DC
  Administered 2013-10-13: 100 mg via ORAL
  Filled 2013-10-12 (×4): qty 1

## 2013-10-12 MED ORDER — ACETAMINOPHEN 325 MG PO TABS: 650.0000 mg | ORAL_TABLET | Freq: Four times a day (QID) | ORAL | Status: DC | PRN

## 2013-10-12 MED ORDER — LEVOTHYROXINE SODIUM 100 MCG PO TABS
100.0000 ug | ORAL_TABLET | Freq: Every day | ORAL | Status: DC
  Administered 2013-10-13: 100 ug via ORAL
  Filled 2013-10-12 (×2): qty 1

## 2013-10-12 MED ORDER — ONDANSETRON HCL 4 MG PO TABS: 4.0000 mg | ORAL_TABLET | Freq: Four times a day (QID) | ORAL | Status: DC | PRN

## 2013-10-12 MED ORDER — FLUTICASONE FUROATE 27.5 MCG/SPRAY NA SUSP: 2.0000 | Freq: Every day | NASAL | Status: DC

## 2013-10-12 MED ORDER — ACETAMINOPHEN 650 MG RE SUPP: 650.0000 mg | Freq: Four times a day (QID) | RECTAL | Status: DC | PRN

## 2013-10-12 NOTE — ED Notes (Signed)
Patient with sinus infection.  She is on antibiotics.  She states she has had several occassions of feeling weak and dizzy.  Today she abd pain while at beauty shop and she felt weak/dizzy and and went to sit down.  She then went back to bathroom and had bm.  She felt worse and she slid down the wall to the floor.  Patient went to lie down.  EMS called to scene.  Patient was found to have bp of 80/50.  Frequent pvc on monitor. Patient with trigeminy on the monitor upon arrival, now with pvc noted.  Patient with orthostatic changes but heart rate is in 70's.  Patient denies any chest pain.  Denies any deficits.  She complains of generalized weakness only.

## 2013-10-12 NOTE — ED Provider Notes (Signed)
CSN: [id number]     Arrival date & time 10/12/13  1647 History   First MD Initiated Contact with Patient 10/12/13 1656     Chief Complaint  Patient presents with  . Dizziness  . Weakness     (Consider location/radiation/quality/duration/timing/severity/associated sxs/prior Treatment) HPI Comments: Carla [name] is a 78 y.o. female with a past medical history of HTN, IBS, Asthma presenting the Emergency Department with a chief complaint of 2 episodes of near syncope.  The patient reports LLQ discomfort, she states she got up and had a normal non-bloody BM, after standing to wash her had she reports feeling lightheaded and having to sit down. She denies LOC.  The pateint denies SOB, chest pain, palpitations, nausea, or vomiting. She reports generalized weakness. The patient reports similar episodes in the past with near syncope, most recent 2 weeks ago. The patient reports abdominal discomfort resolved after BM, denies abdominal discomfort in the ED. Pt was started on Levaquin and prednisone on 10/08/2013 for a sinus infection. Tivis Ringer, MD   Patient is a 78 y.o. female presenting with dizziness and weakness. The history is provided by the patient and medical records. No language interpreter was used.  Dizziness Associated symptoms: no nausea and no vomiting   Weakness Associated symptoms include abdominal pain, congestion, fatigue and weakness. Pertinent negatives include no chills, diaphoresis, fever, nausea, numbness or vomiting.    No past medical history on file. No past surgical history on file. No family history on file. History  Substance Use Topics  . Smoking status: Not on file  . Smokeless tobacco: Not on file  . Alcohol Use: Not on file   OB History   No data available     Review of Systems  Constitutional: Positive for fatigue. Negative for fever, chills and diaphoresis.  HENT: Positive for congestion, rhinorrhea and sinus pressure.   Gastrointestinal:  Positive for abdominal pain. Negative for nausea, vomiting and constipation.  Neurological: Positive for dizziness, weakness and light-headedness. Negative for numbness.      Allergies  Ceclor; Darvon; and Doxycycline  Home Medications  No current outpatient prescriptions on file. BP 102/45  Pulse 71  Temp(Src) 97.6 F (36.4 C) (Oral)  Resp 20  Ht 5\' 2"  (1.575 m)  Wt 160 lb (72.576 kg)  BMI 29.26 kg/m2  SpO2 100% Physical Exam  Nursing note and vitals reviewed. Constitutional: She is oriented to person, place, and time. She appears well-developed and well-nourished. No distress.  HENT:  Head: Normocephalic and atraumatic.  Left Ear: Tympanic membrane normal.  Right ear occluded by cerumen  Eyes: EOM are normal. Pupils are equal, round, and reactive to light.  Neck: Neck supple.  Cardiovascular: Normal rate.  An irregular rhythm present.  Pulses:      Radial pulses are 2+ on the right side, and 2+ on the left side.       Dorsalis pedis pulses are 2+ on the right side, and 2+ on the left side.  No lower extremity edema  Pulmonary/Chest: Effort normal and breath sounds normal. She has no wheezes. She has no rales.  Abdominal: Soft. There is no tenderness. There is no rebound and no guarding.  Musculoskeletal:  Moves all 4 extremitites  Neurological: She is alert and oriented to person, place, and time. No sensory deficit. She exhibits normal muscle tone. GCS eye subscore is 4. GCS verbal subscore is 5. GCS motor subscore is 6.  Speech is clear and goal oriented, follows commands Cranial nerves III -  XII grossly intact, no facial droop Normal strength in upper and lower extremities bilaterally, strong and equal grip strength Sensation normal to light and sharp touch Moves all 4 extremities without ataxia, coordination intact Normal finger to nose and rapid alternating movements   Skin: Skin is warm and dry. No rash noted. She is not diaphoretic.  Psychiatric: She has a  normal mood and affect. Her behavior is normal.    ED Course  Procedures (including critical care time) Labs Review Labs Reviewed  BASIC METABOLIC PANEL - Abnormal; Notable for the following:    BUN 39 (*)    Creatinine, Ser 1.65 (*)    GFR calc non Af Amer 29 (*)    GFR calc Af Amer 34 (*)    All other components within normal limits  CBG MONITORING, ED - Abnormal; Notable for the following:    Glucose-Capillary 66 (*)    All other components within normal limits  CBC  URINALYSIS, ROUTINE W REFLEX MICROSCOPIC  I-STAT TROPOININ, ED  CBG MONITORING, ED   Imaging Review No results found.   EKG Interpretation   Date/Time:  Friday October 12 2013 17:14:11 EDT Ventricular Rate:  72 PR Interval:  162 QRS Duration: 109 QT Interval:  430 QTC Calculation: 471 R Axis:   -45 Text Interpretation:  Sinus rhythm Ventricular trigeminy Left anterior  fascicular block changed from prior EKG Confirmed by BELFI  MD, MELANIE  ([id number]) on 10/12/2013 5:16:49 PM      MDM   Final diagnoses:  Near syncope  Hypotension  PVC (premature ventricular contraction)  Acute kidney injury   Pt presents with a near syncopal event.  She reports similar episodes in the past. BP 102/45, history of HTN will hydrate and re-evaluate. BMP shows elevated Creatinine and BUN, acute kidney injury questionable hypotenstion etiology. Re-eval: BP 117/40 Discussed lab results, and need for admission for further evaluation of her symptoms with the patient and patient's husband.  Dr. Posey Pronto to admit the patient for further evaluation of hypotension and EKG changes.  Meds given in ED:  Medications  sodium chloride 0.9 % bolus 1,000 mL (0 mLs Intravenous Stopped 10/12/13 2004)    New Prescriptions   No medications on file        Lorrine Kin, PA-C 10/15/13 1220

## 2013-10-12 NOTE — H&P (Signed)
[facility] History and Physical  Patient: Carla [name]  [id number]  DOB: 1936/06/17  DOS: the patient was seen and examined on 10/12/2013 PCP: Tivis Ringer, MD  Chief Complaint: Dizziness and near syncope  HPI: Carla [name] is a 78 y.o. female with Past medical history of irritable bowel syndrome, interstitial cystitis, hypertension, left meningioma. The patient is coming from home. The patient presented with an episode of dizziness and lightheadedness. She was outside at a hair salon,she started having complaints of left lower quadrant discomfort, after which she become dizzy lightheaded and had to light on the floor. She went to the restroom, after which she had a regular bowel movement without any blood, came back sat on the chair drink a glass of water and become dizzy lightheaded again and had to lie down on the floor again at which time she felt that she does not pass out. Denies any chest pain palpitation shortness of breath focal neurological deficit blurring of the vision vomiting or changes in her medication. When EMS arrived she was found to be hypotensive with blood pressure in 80s and was brought to the hospital. She has recently been started on Levaquin and prednisone, prednisone for 3 days which she has completed the course and Levaquin for 6 days, for sinusitis. She mentions she has been having similar episodes since last few years but recently the last few weeks has been progressively worsening, she denies any major trauma or head injury. She has history of left-sided meningioma for which she has workup done in 2009 and since it was stable no further workup was done. She mentions she had a negative cath in 2008 by Dr. Lovena Le.  Review of Systems: as mentioned in the history of present illness.  A Comprehensive review of the other systems is negative.  Past Medical History  Diagnosis Date  . Arthritis   . IBS (irritable bowel syndrome)   .  Interstitial cystitis   . Asthma   . High cholesterol   . Hypertension    Past Surgical History  Procedure Laterality Date  . Cholecystectomy     Social History:  reports that she has never smoked. She does not have any smokeless tobacco history on file. She reports that she drinks alcohol. Her drug history is not on file. Independent for most of her  ADL.  Allergies  Allergen Reactions  . Ceclor [Cefaclor]     unknown  . Darvon [Propoxyphene]     unknown  . Doxycycline     unknown    No family history on file.  Prior to Admission medications   Medication Sig Start Date End Date Taking? Authorizing Provider  amitriptyline (ELAVIL) 25 MG tablet Take 25 mg by mouth daily.   Yes Historical Provider, MD  budesonide-formoterol (SYMBICORT) 160-4.5 MCG/ACT inhaler Inhale 2 puffs into the lungs 2 (two) times daily.   Yes Historical Provider, MD  FIBER PO Take 1 tablet by mouth at bedtime.   Yes Historical Provider, MD  flurazepam (DALMANE) 15 MG capsule Take 15 mg by mouth 3 (three) times daily.   Yes Historical Provider, MD  fluticasone (VERAMYST) 27.5 MCG/SPRAY nasal spray Place 2 sprays into the nose daily.   Yes Historical Provider, MD  HYDROCHLOROTHIAZIDE PO Take by mouth.   Yes Historical Provider, MD  HYDROcodone-homatropine (HYCODAN) 5-1.5 MG/5ML syrup Take 5 mLs by mouth every 6 (six) hours as needed for cough.   Yes Historical Provider, MD  hydrOXYzine (ATARAX/VISTARIL) 25 MG tablet Take 25 mg  by mouth at bedtime.   Yes Historical Provider, MD  levofloxacin (LEVAQUIN) 500 MG tablet Take 500 mg by mouth See admin instructions. Patient picked up prescription on 10-08-13.. Take 500mg  daily for 7 days. Patient took the sixth tablet today(10-12-13)   Yes Historical Provider, MD  levothyroxine (SYNTHROID, LEVOTHROID) 100 MCG tablet Take 100 mcg by mouth daily before breakfast.   Yes Historical Provider, MD  Linaclotide (LINZESS) 145 MCG CAPS capsule Take 145 mcg by mouth at bedtime.    Yes Historical Provider, MD  montelukast (SINGULAIR) 10 MG tablet Take 10 mg by mouth at bedtime.   Yes Historical Provider, MD  moxifloxacin (VIGAMOX) 0.5 % ophthalmic solution Place 1 drop into both eyes 3 (three) times daily.   Yes Historical Provider, MD  Multiple Vitamin (MULTIVITAMIN WITH MINERALS) TABS tablet Take 1 tablet by mouth daily.   Yes Historical Provider, MD  olmesartan (BENICAR) 20 MG tablet Take 20 mg by mouth at bedtime.   Yes Historical Provider, MD  pentosan polysulfate (ELMIRON) 100 MG capsule Take 100 mg by mouth 3 (three) times daily.   Yes Historical Provider, MD  simvastatin (ZOCOR) 40 MG tablet Take 40 mg by mouth at bedtime.   Yes Historical Provider, MD    Physical Exam: Filed Vitals:   10/12/13 1725 10/12/13 1830 10/12/13 1845 10/12/13 2000  BP:  112/70 114/40 126/57  Pulse: 71  75 80  Temp:      TempSrc:      Resp: 20 20 16 22   Height:      Weight:      SpO2: 100%  90% 98%    General: Alert, Awake and Oriented to Time, Place and Person. Appear in mild distress Eyes: PERRL ENT: Oral Mucosa clear moist. Neck: no JVD Cardiovascular: S1 and S2 Present, no Murmur, Peripheral Pulses Present Respiratory: Bilateral Air entry equal and Decreased, bilateral basal Crackles,no wheezes Abdomen: Bowel Sound Present, Soft and Non tender Skin: no Rash Extremities: no Pedal edema, no calf tenderness Neurologic: Mental status awake and oriented normal speech, Cranial Nerves pupils are reactive extraocular muscle movement intact, Motor strength bilaterally equal strength, Sensation present to light touch, reflexes present, babinski negative, Proprioception normal, Cerebellar test normal finger-nose-finger.  Labs on Admission:  CBC:  Recent Labs Lab 10/12/13 1657  WBC 10.0  HGB 13.1  HCT 38.7  MCV 88.2  PLT 267    CMP     Component Value Date/Time   NA 141 10/12/2013 1657   K 3.9 10/12/2013 1657   CL 104 10/12/2013 1657   CO2 22 10/12/2013 1657   GLUCOSE  87 10/12/2013 1657   BUN 39* 10/12/2013 1657   CREATININE 1.65* 10/12/2013 1657   CALCIUM 9.0 10/12/2013 1657   GFRNONAA 29* 10/12/2013 1657   GFRAA 34* 10/12/2013 1657    No results found for this basename: LIPASE, AMYLASE,  in the last 168 hours No results found for this basename: AMMONIA,  in the last 168 hours  No results found for this basename: CKTOTAL, CKMB, CKMBINDEX, TROPONINI,  in the last 168 hours BNP (last 3 results) No results found for this basename: PROBNP,  in the last 8760 hours  Radiological Exams on Admission: Dg Chest 2 View  10/12/2013   CLINICAL DATA:  Sinusitis, abnormal lung sounds  EXAM: CHEST  2 VIEW  COMPARISON:  DG CHEST 2V dated 06/01/2010  FINDINGS: Normal cardiac silhouette. There is chronic elevation of right hemidiaphragm. No effusion, infiltrate, or pneumothorax. Mild bibasilar atelectasis.  IMPRESSION: 1.  No acute cardiopulmonary process.  . 2. Chronic elevation of the right hemidiaphragm. 3. Mild bibasilar atelectasis   Electronically Signed   By: Suzy Bouchard M.D.   On: 10/12/2013 23:10   Ct Head Wo Contrast  10/12/2013   CLINICAL DATA:  Multiple recent episodes of dizziness and generalize weakness. Known left frontal meningioma.  EXAM: CT HEAD WITHOUT CONTRAST  TECHNIQUE: Contiguous axial images were obtained from the base of the skull through the vertex without intravenous contrast.  COMPARISON:  MR HEAD WO/W CM dated 05/16/2008; MR HEAD WO/W CM dated 09/06/2007  FINDINGS: Calcified left frontal meningioma measuring approximately 2.0 x 2.2 cm, unchanged. Mild cortical and deep atrophy, unchanged. Moderate to severe changes of small vessel disease of the cerebral white matter, unchanged. Old lacunar stroke in the deep white matter of the left frontal lobe, unchanged. No mass lesion. No midline shift. No acute hemorrhage or hematoma. No extra-axial fluid collections. No evidence of acute infarction.  No skull fracture or other focal osseous abnormality involving  the skull. Visualized paranasal sinuses, bilateral mastoid air cells, and bilateral middle ear cavities well-aerated. Bilateral carotid siphon atherosclerosis.  IMPRESSION: 1. Stable calcified left frontal meningioma. 2. No acute intracranial abnormality. 3. Mild age-appropriate generalized atrophy. Moderate to severe chronic microvascular ischemic changes of the white matter. 4. Stable old lacunar stroke in the deep white matter of the left frontal lobe.   Electronically Signed   By: Evangeline Dakin M.D.   On: 10/12/2013 22:56    EKG: Independently reviewed. normal EKG, normal sinus rhythm, frequent PVC's noted.  Assessment/Plan Principal Problem:   Dizziness Active Problems:   Near syncope   Meningioma   Irritable bowel syndrome   Frequent PVCs   Hypotension, unspecified   1. Dizziness The patient is presenting with episodes of dizziness associated with hypotension. She has acute kidney injury. She denies any recent diarrhea or vomiting or dehydration. Multifactorial dizziness at this point I would like to get a CT of her head to rule out any changes in her meningioma. Other possible etiologies cardiac including frequent PVCs and arrhythmia she will be monitored on telemetry. I would opt an echocardiogram and serial troponins, she may require stress test for further evaluation. PTOT consultation for further workup I will consider neuro checks check electrolytes, once her blood pressure is stable she may require beta blockers for her frequent PVCs.  2. Irritable bowel syndrome Continue to monitor at present stable  3. Interstitial cystitis Continue home regimen  4. history of hypertension Holding her antihypertensive medications lisinopril and hydrochlorothiazide.  DVT Prophylaxis: subcutaneous Heparin Nutrition: Cardiac diet  Code Status: Full  Family Communication: Husband was present at bedside, opportunity was given to ask question and all questions were answered  satisfactorily at the time of interview. Disposition: Admitted to observation in telemetry unit.  Author: Berle Mull, MD [facility] Pager: [phone] 10/12/2013, 9:16 PM    If 7PM-7AM, please contact night-coverage [email or web address] Password [email or web address]

## 2013-10-12 NOTE — ED Notes (Signed)
Attempted to call report, nurse unavailable.

## 2013-10-13 DIAGNOSIS — R55 Syncope and collapse: Secondary | ICD-10-CM | POA: Diagnosis not present

## 2013-10-13 DIAGNOSIS — I959 Hypotension, unspecified: Secondary | ICD-10-CM | POA: Diagnosis not present

## 2013-10-13 DIAGNOSIS — N179 Acute kidney failure, unspecified: Secondary | ICD-10-CM

## 2013-10-13 DIAGNOSIS — R42 Dizziness and giddiness: Secondary | ICD-10-CM | POA: Diagnosis not present

## 2013-10-13 LAB — COMPREHENSIVE METABOLIC PANEL
ALT: 21 U/L (ref 0–35)
AST: 15 U/L (ref 0–37)
Albumin: 2.7 g/dL — ABNORMAL LOW (ref 3.5–5.2)
Alkaline Phosphatase: 56 U/L (ref 39–117)
BUN: 32 mg/dL — ABNORMAL HIGH (ref 6–23)
CO2: 20 mEq/L (ref 19–32)
Calcium: 8.5 mg/dL (ref 8.4–10.5)
Chloride: 110 mEq/L (ref 96–112)
Creatinine, Ser: 1.52 mg/dL — ABNORMAL HIGH (ref 0.50–1.10)
GFR calc Af Amer: 38 mL/min — ABNORMAL LOW (ref >90)
GFR calc non Af Amer: 32 mL/min — ABNORMAL LOW (ref >90)
Glucose, Bld: 92 mg/dL (ref 70–99)
Potassium: 4.5 mEq/L (ref 3.7–5.3)
Sodium: 144 mEq/L (ref 137–147)
Total Bilirubin: <0.2 mg/dL — ABNORMAL LOW (ref 0.3–1.2)
Total Protein: 5.9 g/dL — ABNORMAL LOW (ref 6.0–8.3)

## 2013-10-13 LAB — CBC WITH DIFFERENTIAL/PLATELET
Basophils Absolute: 0 10*3/uL (ref 0.0–0.1)
Basophils Relative: 1 % (ref 0–1)
Eosinophils Absolute: 0.4 10*3/uL (ref 0.0–0.7)
Eosinophils Relative: 5 % (ref 0–5)
HCT: 35.1 % — ABNORMAL LOW (ref 36.0–46.0)
Hemoglobin: 12.2 g/dL (ref 12.0–15.0)
Lymphocytes Relative: 36 % (ref 12–46)
Lymphs Abs: 2.4 10*3/uL (ref 0.7–4.0)
MCH: 30.7 pg (ref 26.0–34.0)
MCHC: 34.8 g/dL (ref 30.0–36.0)
MCV: 88.2 fL (ref 78.0–100.0)
Monocytes Absolute: 0.6 10*3/uL (ref 0.1–1.0)
Monocytes Relative: 9 % (ref 3–12)
Neutro Abs: 3.4 10*3/uL (ref 1.7–7.7)
Neutrophils Relative %: 49 % (ref 43–77)
Platelets: 247 10*3/uL (ref 150–400)
RBC: 3.98 MIL/uL (ref 3.87–5.11)
RDW: 13.1 % (ref 11.5–15.5)
WBC: 6.8 10*3/uL (ref 4.0–10.5)

## 2013-10-13 LAB — TROPONIN I
Troponin I: <0.3 ng/mL (ref <0.30)
Troponin I: <0.3 ng/mL (ref <0.30)

## 2013-10-13 LAB — PROTIME-INR
INR: 1.13 (ref 0.00–1.49)
Prothrombin Time: 14.3 seconds (ref 11.6–15.2)

## 2013-10-13 MED ORDER — ASPIRIN EC 81 MG PO TBEC: 81.0000 mg | DELAYED_RELEASE_TABLET | Freq: Every day | ORAL | Status: DC

## 2013-10-13 NOTE — Progress Notes (Signed)
Echo Lab  2D Echocardiogram completed.  [place], [place] 10/13/2013 2:20 PM

## 2013-10-13 NOTE — Discharge Summary (Addendum)
Physician Discharge Summary  Carla [name] EUM:[id number] DOB: Jul 27, 1935 DOA: 10/12/2013  PCP: Tivis Ringer, MD  Admit date: 10/12/2013 Discharge date: 10/13/2013  Time spent: 35 minutes  Recommendations for Outpatient Follow-up:  1. Needs BP follow up. 2. Need B-met to follow renal function.  3. Need MRA brain, carotid doppler.   Discharge Diagnoses:    Near syncope   Acute kidney injury   Meningioma   Irritable bowel syndrome   Frequent PVCs   Hypotension, unspecified     Discharge Condition: Stable.   Diet recommendation: Regular.   Filed Weights   10/12/13 1714 10/12/13 2120 10/13/13 0520  Weight: 72.576 kg (160 lb) 75.2 kg (165 lb 12.6 oz) 75.2 kg (165 lb 12.6 oz)    History of present illness:  Carla [name] is a 78 y.o. female with Past medical history of irritable bowel syndrome, interstitial cystitis, hypertension, left meningioma.  The patient is coming from home.  The patient presented with an episode of dizziness and lightheadedness. She was outside at a hair salon,she started having complaints of left lower quadrant discomfort, after which she become dizzy lightheaded and had to light on the floor. She went to the restroom, after which she had a regular bowel movement without any blood, came back sat on the chair drink a glass of water and become dizzy lightheaded again and had to lie down on the floor again at which time she felt that she does not pass out. Denies any chest pain palpitation shortness of breath focal neurological deficit blurring of the vision vomiting or changes in her medication.  When EMS arrived she was found to be hypotensive with blood pressure in 80s and was brought to the hospital.  She has recently been started on Levaquin and prednisone, prednisone for 3 days which she has completed the course and Levaquin for 6 days, for sinusitis.  She mentions she has been having similar episodes since last few years but recently the last  few weeks has been progressively worsening, she denies any major trauma or head injury.  She has history of left-sided meningioma for which she has workup done in 2009 and since it was stable no further workup was done.  She mentions she had a negative cath in 2008 by Dr. Lovena Le.   Hospital Course:  1-Near Syncope; probably in setting of hypotension, vs vaso vagal respond. Resolved. Patient feeling well. Back to baseline. No lightheaded. BP improved. Will continue to hold BP medications at discharge. ECHO with normal EF. Troponin times 3 negative. CT head with stable meningioma. Old stroke. I recommended to patient to have an MRI, she decline. I explain to patient that we wouldn't be able to see on CT head stroke in the cerebellum which could cause dizziness, balance problems.   2-AKI; cr on admission at 1.6. After IV fluids cr has decreased to 1.5. Patient wants to go home. Will continue to hold BP medications. Need B-met on Monday.   3-Stable old lacunar stroke in the deep white matter of the left  frontal lobe. Patient was not aware of history of stroke. I recommend MRI brain, MRA and carotid doppler. She declines this test inpatient. She wants to follow up with PCP. Husband was at bedside during the discussion. She will take baby aspirin and continue to take her cholesterol medications.    Procedures: ECHO; Left ventricle: The cavity size was normal. There was mild concentric hypertrophy. Systolic function was normal. The estimated ejection fraction was in the range  of 55% to 60%. Wall motion was normal; there were no regional wall motion abnormalities. Doppler parameters are consistent with abnormal left ventricular relaxation (grade 1 diastolic dysfunction). - Mitral valve: Mild regurgitation.    Consultations:  none  Discharge Exam: Filed Vitals:   10/13/13 1346  BP: 106/53  Pulse: 86  Temp: 98.1 F (36.7 C)  Resp: 20    General: No distress.  Cardiovascular: S 1, S 2  RRR Respiratory: CTA  Discharge Instructions      Discharge Orders   Future Orders Complete By Expires   Diet general  As directed    Increase activity slowly  As directed        Medication List    STOP taking these medications       HYDROCHLOROTHIAZIDE PO     levofloxacin 500 MG tablet  Commonly known as:  LEVAQUIN     olmesartan 20 MG tablet  Commonly known as:  BENICAR     predniSONE 10 MG tablet  Commonly known as:  DELTASONE      TAKE these medications       amitriptyline 25 MG tablet  Commonly known as:  ELAVIL  Take 25 mg by mouth daily.     aspirin EC 81 MG tablet  Take 1 tablet (81 mg total) by mouth daily.     budesonide-formoterol 160-4.5 MCG/ACT inhaler  Commonly known as:  SYMBICORT  Inhale 2 puffs into the lungs 2 (two) times daily.     FIBER PO  Take 1 tablet by mouth at bedtime.     flurazepam 15 MG capsule  Commonly known as:  DALMANE  Take 15 mg by mouth 3 (three) times daily.     fluticasone 27.5 MCG/SPRAY nasal spray  Commonly known as:  VERAMYST  Place 2 sprays into the nose daily.     HYDROcodone-homatropine 5-1.5 MG/5ML syrup  Commonly known as:  HYCODAN  Take 5 mLs by mouth every 6 (six) hours as needed for cough.     hydrOXYzine 25 MG tablet  Commonly known as:  ATARAX/VISTARIL  Take 25 mg by mouth at bedtime.     levothyroxine 100 MCG tablet  Commonly known as:  SYNTHROID, LEVOTHROID  Take 100 mcg by mouth daily before breakfast.     LINZESS 145 MCG Caps capsule  Generic drug:  Linaclotide  Take 145 mcg by mouth at bedtime.     montelukast 10 MG tablet  Commonly known as:  SINGULAIR  Take 10 mg by mouth at bedtime.     moxifloxacin 0.5 % ophthalmic solution  Commonly known as:  VIGAMOX  Place 1 drop into both eyes 3 (three) times daily.     multivitamin with minerals Tabs tablet  Take 1 tablet by mouth daily.     pentosan polysulfate 100 MG capsule  Commonly known as:  ELMIRON  Take 100 mg by mouth 3 (three)  times daily.     simvastatin 40 MG tablet  Commonly known as:  ZOCOR  Take 40 mg by mouth at bedtime.       Allergies  Allergen Reactions  . Ceclor [Cefaclor]     unknown  . Darvon [Propoxyphene]     unknown  . Doxycycline     unknown   Follow-up Information   Follow up with Tivis Ringer, MD In 2 days. (needs lab work. )    Specialty:  Internal Medicine   Contact information:   [facility] [place] [address]  [phone]        The results of significant diagnostics from this hospitalization (including imaging, microbiology, ancillary and laboratory) are listed below for reference.    Significant Diagnostic Studies: Dg Chest 2 View  10/12/2013   CLINICAL DATA:  Sinusitis, abnormal lung sounds  EXAM: CHEST  2 VIEW  COMPARISON:  DG CHEST 2V dated 06/01/2010  FINDINGS: Normal cardiac silhouette. There is chronic elevation of right hemidiaphragm. No effusion, infiltrate, or pneumothorax. Mild bibasilar atelectasis.  IMPRESSION: 1.  No acute cardiopulmonary process.  . 2. Chronic elevation of the right hemidiaphragm. 3. Mild bibasilar atelectasis   Electronically Signed   By: Suzy Bouchard M.D.   On: 10/12/2013 23:10   Ct Head Wo Contrast  10/12/2013   CLINICAL DATA:  Multiple recent episodes of dizziness and generalize weakness. Known left frontal meningioma.  EXAM: CT HEAD WITHOUT CONTRAST  TECHNIQUE: Contiguous axial images were obtained from the base of the skull through the vertex without intravenous contrast.  COMPARISON:  MR HEAD WO/W CM dated 05/16/2008; MR HEAD WO/W CM dated 09/06/2007  FINDINGS: Calcified left frontal meningioma measuring approximately 2.0 x 2.2 cm, unchanged. Mild cortical and deep atrophy, unchanged. Moderate to severe changes of small vessel disease of the cerebral white matter, unchanged. Old lacunar stroke in the deep white matter of the left frontal lobe, unchanged. No mass lesion. No midline shift. No  acute hemorrhage or hematoma. No extra-axial fluid collections. No evidence of acute infarction.  No skull fracture or other focal osseous abnormality involving the skull. Visualized paranasal sinuses, bilateral mastoid air cells, and bilateral middle ear cavities well-aerated. Bilateral carotid siphon atherosclerosis.  IMPRESSION: 1. Stable calcified left frontal meningioma. 2. No acute intracranial abnormality. 3. Mild age-appropriate generalized atrophy. Moderate to severe chronic microvascular ischemic changes of the white matter. 4. Stable old lacunar stroke in the deep white matter of the left frontal lobe.   Electronically Signed   By: Evangeline Dakin M.D.   On: 10/12/2013 22:56    Microbiology: No results found for this or any previous visit (from the past 240 hour(s)).   Labs: Basic Metabolic Panel:  Recent Labs Lab 10/12/13 1657 10/12/13 2259 10/13/13 0623  NA 141  --  144  K 3.9  --  4.5  CL 104  --  110  CO2 22  --  20  GLUCOSE 87  --  92  BUN 39*  --  32*  CREATININE 1.65*  --  1.52*  CALCIUM 9.0  --  8.5  MG  --  1.6  --    Liver Function Tests:  Recent Labs Lab 10/12/13 2259 10/13/13 0623  AST 24 15  ALT 28 21  ALKPHOS 63 56  BILITOT <0.2* <0.2*  PROT 6.7 5.9*  ALBUMIN 3.1* 2.7*   No results found for this basename: LIPASE, AMYLASE,  in the last 168 hours No results found for this basename: AMMONIA,  in the last 168 hours CBC:  Recent Labs Lab 10/12/13 1657 10/13/13 0623  WBC 10.0 6.8  NEUTROABS  --  3.4  HGB 13.1 12.2  HCT 38.7 35.1*  MCV 88.2 88.2  PLT 267 247   Cardiac Enzymes:  Recent Labs Lab 10/12/13 2259 10/13/13 0306 10/13/13 0939  TROPONINI <0.30 <0.30 <0.30   BNP: BNP (last 3 results) No results found for this basename: PROBNP,  in the last 8760 hours CBG:  Recent Labs Lab 10/12/13 1723 10/12/13 1848  GLUCAP 66* 90       Signed:  Najat Olazabal A  [facility] 10/13/2013, 4:19 PM

## 2013-10-15 DIAGNOSIS — Z6831 Body mass index (BMI) 31.0-31.9, adult: Secondary | ICD-10-CM | POA: Diagnosis not present

## 2013-10-15 DIAGNOSIS — J209 Acute bronchitis, unspecified: Secondary | ICD-10-CM | POA: Diagnosis not present

## 2013-10-15 DIAGNOSIS — I959 Hypotension, unspecified: Secondary | ICD-10-CM | POA: Diagnosis not present

## 2013-10-15 DIAGNOSIS — K589 Irritable bowel syndrome without diarrhea: Secondary | ICD-10-CM | POA: Diagnosis not present

## 2013-10-15 DIAGNOSIS — E039 Hypothyroidism, unspecified: Secondary | ICD-10-CM | POA: Diagnosis not present

## 2013-10-15 NOTE — ED Provider Notes (Signed)
Medical screening examination/treatment/procedure(s) were performed by non-physician practitioner and as supervising physician I was immediately available for consultation/collaboration.   EKG Interpretation   Date/Time:  Friday October 12 2013 17:14:11 EDT Ventricular Rate:  72 PR Interval:  162 QRS Duration: 109 QT Interval:  430 QTC Calculation: 471 R Axis:   -45 Text Interpretation:  Sinus rhythm Ventricular trigeminy Left anterior  fascicular block changed from prior EKG Confirmed by Teigan Manner  MD, Merwin Breden  ([id number]) on 10/12/2013 5:16:49 PM        Malvin Johns, MD 10/15/13 6045

## 2013-12-13 DIAGNOSIS — M545 Low back pain, unspecified: Secondary | ICD-10-CM | POA: Diagnosis not present

## 2013-12-13 DIAGNOSIS — M9981 Other biomechanical lesions of cervical region: Secondary | ICD-10-CM | POA: Diagnosis not present

## 2013-12-13 DIAGNOSIS — M542 Cervicalgia: Secondary | ICD-10-CM | POA: Diagnosis not present

## 2013-12-13 DIAGNOSIS — M999 Biomechanical lesion, unspecified: Secondary | ICD-10-CM | POA: Diagnosis not present

## 2014-01-09 DIAGNOSIS — E785 Hyperlipidemia, unspecified: Secondary | ICD-10-CM | POA: Diagnosis not present

## 2014-01-09 DIAGNOSIS — E039 Hypothyroidism, unspecified: Secondary | ICD-10-CM | POA: Diagnosis not present

## 2014-01-09 DIAGNOSIS — M899 Disorder of bone, unspecified: Secondary | ICD-10-CM | POA: Diagnosis not present

## 2014-01-09 DIAGNOSIS — N183 Chronic kidney disease, stage 3 unspecified: Secondary | ICD-10-CM | POA: Diagnosis not present

## 2014-01-09 DIAGNOSIS — I10 Essential (primary) hypertension: Secondary | ICD-10-CM | POA: Diagnosis not present

## 2014-01-10 DIAGNOSIS — K5900 Constipation, unspecified: Secondary | ICD-10-CM | POA: Diagnosis not present

## 2014-01-10 DIAGNOSIS — I10 Essential (primary) hypertension: Secondary | ICD-10-CM | POA: Diagnosis not present

## 2014-01-10 DIAGNOSIS — G4700 Insomnia, unspecified: Secondary | ICD-10-CM | POA: Diagnosis not present

## 2014-01-10 DIAGNOSIS — Z0000 Encounter for general adult medical examination without abnormal findings: Secondary | ICD-10-CM | POA: Diagnosis not present

## 2014-01-10 DIAGNOSIS — Z1331 Encounter for screening for depression: Secondary | ICD-10-CM | POA: Diagnosis not present

## 2014-01-10 DIAGNOSIS — E039 Hypothyroidism, unspecified: Secondary | ICD-10-CM | POA: Diagnosis not present

## 2014-01-10 DIAGNOSIS — Z23 Encounter for immunization: Secondary | ICD-10-CM | POA: Diagnosis not present

## 2014-01-10 DIAGNOSIS — N3010 Interstitial cystitis (chronic) without hematuria: Secondary | ICD-10-CM | POA: Diagnosis not present

## 2014-01-10 DIAGNOSIS — N183 Chronic kidney disease, stage 3 unspecified: Secondary | ICD-10-CM | POA: Diagnosis not present

## 2014-01-10 DIAGNOSIS — E785 Hyperlipidemia, unspecified: Secondary | ICD-10-CM | POA: Diagnosis not present

## 2014-01-17 DIAGNOSIS — R300 Dysuria: Secondary | ICD-10-CM | POA: Diagnosis not present

## 2014-03-18 DIAGNOSIS — K219 Gastro-esophageal reflux disease without esophagitis: Secondary | ICD-10-CM | POA: Diagnosis not present

## 2014-03-18 DIAGNOSIS — J209 Acute bronchitis, unspecified: Secondary | ICD-10-CM | POA: Diagnosis not present

## 2014-03-18 DIAGNOSIS — I10 Essential (primary) hypertension: Secondary | ICD-10-CM | POA: Diagnosis not present

## 2014-03-18 DIAGNOSIS — K589 Irritable bowel syndrome without diarrhea: Secondary | ICD-10-CM | POA: Diagnosis not present

## 2014-03-18 DIAGNOSIS — R059 Cough, unspecified: Secondary | ICD-10-CM | POA: Diagnosis not present

## 2014-03-18 DIAGNOSIS — J309 Allergic rhinitis, unspecified: Secondary | ICD-10-CM | POA: Diagnosis not present

## 2014-03-18 DIAGNOSIS — J45909 Unspecified asthma, uncomplicated: Secondary | ICD-10-CM | POA: Diagnosis not present

## 2014-03-18 DIAGNOSIS — R05 Cough: Secondary | ICD-10-CM | POA: Diagnosis not present

## 2014-03-18 DIAGNOSIS — J069 Acute upper respiratory infection, unspecified: Secondary | ICD-10-CM | POA: Diagnosis not present

## 2014-03-28 DIAGNOSIS — J029 Acute pharyngitis, unspecified: Secondary | ICD-10-CM | POA: Diagnosis not present

## 2014-03-28 DIAGNOSIS — J209 Acute bronchitis, unspecified: Secondary | ICD-10-CM | POA: Diagnosis not present

## 2014-04-23 DIAGNOSIS — H6120 Impacted cerumen, unspecified ear: Secondary | ICD-10-CM | POA: Diagnosis not present

## 2014-04-23 DIAGNOSIS — H9190 Unspecified hearing loss, unspecified ear: Secondary | ICD-10-CM | POA: Diagnosis not present

## 2014-04-28 DIAGNOSIS — Z23 Encounter for immunization: Secondary | ICD-10-CM | POA: Diagnosis not present

## 2014-05-24 DIAGNOSIS — K589 Irritable bowel syndrome without diarrhea: Secondary | ICD-10-CM | POA: Diagnosis not present

## 2014-05-24 DIAGNOSIS — J309 Allergic rhinitis, unspecified: Secondary | ICD-10-CM | POA: Diagnosis not present

## 2014-05-24 DIAGNOSIS — N183 Chronic kidney disease, stage 3 (moderate): Secondary | ICD-10-CM | POA: Diagnosis not present

## 2014-05-24 DIAGNOSIS — J069 Acute upper respiratory infection, unspecified: Secondary | ICD-10-CM | POA: Diagnosis not present

## 2014-05-24 DIAGNOSIS — L0889 Other specified local infections of the skin and subcutaneous tissue: Secondary | ICD-10-CM | POA: Diagnosis not present

## 2014-05-24 DIAGNOSIS — I10 Essential (primary) hypertension: Secondary | ICD-10-CM | POA: Diagnosis not present

## 2014-05-24 DIAGNOSIS — Z6832 Body mass index (BMI) 32.0-32.9, adult: Secondary | ICD-10-CM | POA: Diagnosis not present

## 2014-05-24 DIAGNOSIS — R05 Cough: Secondary | ICD-10-CM | POA: Diagnosis not present

## 2014-07-09 DIAGNOSIS — I10 Essential (primary) hypertension: Secondary | ICD-10-CM | POA: Diagnosis not present

## 2014-07-09 DIAGNOSIS — N183 Chronic kidney disease, stage 3 (moderate): Secondary | ICD-10-CM | POA: Diagnosis not present

## 2014-07-09 DIAGNOSIS — L0889 Other specified local infections of the skin and subcutaneous tissue: Secondary | ICD-10-CM | POA: Diagnosis not present

## 2014-07-09 DIAGNOSIS — Z6832 Body mass index (BMI) 32.0-32.9, adult: Secondary | ICD-10-CM | POA: Diagnosis not present

## 2014-07-09 DIAGNOSIS — J309 Allergic rhinitis, unspecified: Secondary | ICD-10-CM | POA: Diagnosis not present

## 2014-07-09 DIAGNOSIS — E039 Hypothyroidism, unspecified: Secondary | ICD-10-CM | POA: Diagnosis not present

## 2014-07-09 DIAGNOSIS — K589 Irritable bowel syndrome without diarrhea: Secondary | ICD-10-CM | POA: Diagnosis not present

## 2014-08-08 DIAGNOSIS — H2513 Age-related nuclear cataract, bilateral: Secondary | ICD-10-CM | POA: Diagnosis not present

## 2014-08-08 DIAGNOSIS — H43813 Vitreous degeneration, bilateral: Secondary | ICD-10-CM | POA: Diagnosis not present

## 2014-08-09 DIAGNOSIS — Z1231 Encounter for screening mammogram for malignant neoplasm of breast: Secondary | ICD-10-CM | POA: Diagnosis not present

## 2014-10-29 DIAGNOSIS — J069 Acute upper respiratory infection, unspecified: Secondary | ICD-10-CM | POA: Diagnosis not present

## 2014-10-29 DIAGNOSIS — R05 Cough: Secondary | ICD-10-CM | POA: Diagnosis not present

## 2014-10-29 DIAGNOSIS — Z6831 Body mass index (BMI) 31.0-31.9, adult: Secondary | ICD-10-CM | POA: Diagnosis not present

## 2014-10-29 DIAGNOSIS — J45909 Unspecified asthma, uncomplicated: Secondary | ICD-10-CM | POA: Diagnosis not present

## 2014-11-05 DIAGNOSIS — R062 Wheezing: Secondary | ICD-10-CM | POA: Diagnosis not present

## 2014-11-05 DIAGNOSIS — J069 Acute upper respiratory infection, unspecified: Secondary | ICD-10-CM | POA: Diagnosis not present

## 2014-11-05 DIAGNOSIS — J45909 Unspecified asthma, uncomplicated: Secondary | ICD-10-CM | POA: Diagnosis not present

## 2014-11-05 DIAGNOSIS — I10 Essential (primary) hypertension: Secondary | ICD-10-CM | POA: Diagnosis not present

## 2014-11-05 DIAGNOSIS — Z6831 Body mass index (BMI) 31.0-31.9, adult: Secondary | ICD-10-CM | POA: Diagnosis not present

## 2014-11-05 DIAGNOSIS — R05 Cough: Secondary | ICD-10-CM | POA: Diagnosis not present

## 2014-11-05 DIAGNOSIS — J309 Allergic rhinitis, unspecified: Secondary | ICD-10-CM | POA: Diagnosis not present

## 2014-11-20 DIAGNOSIS — J310 Chronic rhinitis: Secondary | ICD-10-CM | POA: Diagnosis not present

## 2014-11-20 DIAGNOSIS — J4530 Mild persistent asthma, uncomplicated: Secondary | ICD-10-CM | POA: Diagnosis not present

## 2014-11-20 DIAGNOSIS — J329 Chronic sinusitis, unspecified: Secondary | ICD-10-CM | POA: Diagnosis not present

## 2014-12-09 DIAGNOSIS — H2513 Age-related nuclear cataract, bilateral: Secondary | ICD-10-CM | POA: Diagnosis not present

## 2014-12-10 DIAGNOSIS — H6121 Impacted cerumen, right ear: Secondary | ICD-10-CM | POA: Diagnosis not present

## 2014-12-10 DIAGNOSIS — H903 Sensorineural hearing loss, bilateral: Secondary | ICD-10-CM | POA: Diagnosis not present

## 2015-01-01 DIAGNOSIS — J4540 Moderate persistent asthma, uncomplicated: Secondary | ICD-10-CM | POA: Diagnosis not present

## 2015-01-01 DIAGNOSIS — K219 Gastro-esophageal reflux disease without esophagitis: Secondary | ICD-10-CM | POA: Diagnosis not present

## 2015-01-01 DIAGNOSIS — J310 Chronic rhinitis: Secondary | ICD-10-CM | POA: Diagnosis not present

## 2015-01-15 DIAGNOSIS — E785 Hyperlipidemia, unspecified: Secondary | ICD-10-CM | POA: Diagnosis not present

## 2015-01-15 DIAGNOSIS — I10 Essential (primary) hypertension: Secondary | ICD-10-CM | POA: Diagnosis not present

## 2015-01-15 DIAGNOSIS — E039 Hypothyroidism, unspecified: Secondary | ICD-10-CM | POA: Diagnosis not present

## 2015-01-15 DIAGNOSIS — E559 Vitamin D deficiency, unspecified: Secondary | ICD-10-CM | POA: Diagnosis not present

## 2015-01-15 DIAGNOSIS — R8299 Other abnormal findings in urine: Secondary | ICD-10-CM | POA: Diagnosis not present

## 2015-01-15 DIAGNOSIS — N390 Urinary tract infection, site not specified: Secondary | ICD-10-CM | POA: Diagnosis not present

## 2015-01-22 DIAGNOSIS — E039 Hypothyroidism, unspecified: Secondary | ICD-10-CM | POA: Diagnosis not present

## 2015-01-22 DIAGNOSIS — K589 Irritable bowel syndrome without diarrhea: Secondary | ICD-10-CM | POA: Diagnosis not present

## 2015-01-22 DIAGNOSIS — I10 Essential (primary) hypertension: Secondary | ICD-10-CM | POA: Diagnosis not present

## 2015-01-22 DIAGNOSIS — Z1389 Encounter for screening for other disorder: Secondary | ICD-10-CM | POA: Diagnosis not present

## 2015-01-22 DIAGNOSIS — E559 Vitamin D deficiency, unspecified: Secondary | ICD-10-CM | POA: Diagnosis not present

## 2015-01-22 DIAGNOSIS — E785 Hyperlipidemia, unspecified: Secondary | ICD-10-CM | POA: Diagnosis not present

## 2015-01-22 DIAGNOSIS — M109 Gout, unspecified: Secondary | ICD-10-CM | POA: Diagnosis not present

## 2015-01-22 DIAGNOSIS — N183 Chronic kidney disease, stage 3 (moderate): Secondary | ICD-10-CM | POA: Diagnosis not present

## 2015-01-22 DIAGNOSIS — Z0000 Encounter for general adult medical examination without abnormal findings: Secondary | ICD-10-CM | POA: Diagnosis not present

## 2015-01-22 DIAGNOSIS — N3010 Interstitial cystitis (chronic) without hematuria: Secondary | ICD-10-CM | POA: Diagnosis not present

## 2015-01-22 DIAGNOSIS — M419 Scoliosis, unspecified: Secondary | ICD-10-CM | POA: Diagnosis not present

## 2015-01-22 DIAGNOSIS — Z6832 Body mass index (BMI) 32.0-32.9, adult: Secondary | ICD-10-CM | POA: Diagnosis not present

## 2015-01-31 DIAGNOSIS — M9901 Segmental and somatic dysfunction of cervical region: Secondary | ICD-10-CM | POA: Diagnosis not present

## 2015-01-31 DIAGNOSIS — M9903 Segmental and somatic dysfunction of lumbar region: Secondary | ICD-10-CM | POA: Diagnosis not present

## 2015-01-31 DIAGNOSIS — M542 Cervicalgia: Secondary | ICD-10-CM | POA: Diagnosis not present

## 2015-01-31 DIAGNOSIS — M545 Low back pain: Secondary | ICD-10-CM | POA: Diagnosis not present

## 2015-01-31 DIAGNOSIS — M9904 Segmental and somatic dysfunction of sacral region: Secondary | ICD-10-CM | POA: Diagnosis not present

## 2015-02-03 DIAGNOSIS — Z1212 Encounter for screening for malignant neoplasm of rectum: Secondary | ICD-10-CM | POA: Diagnosis not present

## 2015-02-04 DIAGNOSIS — M9903 Segmental and somatic dysfunction of lumbar region: Secondary | ICD-10-CM | POA: Diagnosis not present

## 2015-02-04 DIAGNOSIS — M9904 Segmental and somatic dysfunction of sacral region: Secondary | ICD-10-CM | POA: Diagnosis not present

## 2015-02-04 DIAGNOSIS — M9901 Segmental and somatic dysfunction of cervical region: Secondary | ICD-10-CM | POA: Diagnosis not present

## 2015-02-04 DIAGNOSIS — M542 Cervicalgia: Secondary | ICD-10-CM | POA: Diagnosis not present

## 2015-02-04 DIAGNOSIS — M545 Low back pain: Secondary | ICD-10-CM | POA: Diagnosis not present

## 2015-02-10 DIAGNOSIS — M9903 Segmental and somatic dysfunction of lumbar region: Secondary | ICD-10-CM | POA: Diagnosis not present

## 2015-02-10 DIAGNOSIS — M545 Low back pain: Secondary | ICD-10-CM | POA: Diagnosis not present

## 2015-02-10 DIAGNOSIS — M9901 Segmental and somatic dysfunction of cervical region: Secondary | ICD-10-CM | POA: Diagnosis not present

## 2015-02-10 DIAGNOSIS — M542 Cervicalgia: Secondary | ICD-10-CM | POA: Diagnosis not present

## 2015-02-10 DIAGNOSIS — M9904 Segmental and somatic dysfunction of sacral region: Secondary | ICD-10-CM | POA: Diagnosis not present

## 2015-02-24 DIAGNOSIS — Z01419 Encounter for gynecological examination (general) (routine) without abnormal findings: Secondary | ICD-10-CM | POA: Diagnosis not present

## 2015-02-24 DIAGNOSIS — L292 Pruritus vulvae: Secondary | ICD-10-CM | POA: Diagnosis not present

## 2015-02-24 DIAGNOSIS — N952 Postmenopausal atrophic vaginitis: Secondary | ICD-10-CM | POA: Diagnosis not present

## 2015-02-24 DIAGNOSIS — Z124 Encounter for screening for malignant neoplasm of cervix: Secondary | ICD-10-CM | POA: Diagnosis not present

## 2015-03-06 DIAGNOSIS — M4697 Unspecified inflammatory spondylopathy, lumbosacral region: Secondary | ICD-10-CM | POA: Diagnosis not present

## 2015-03-06 DIAGNOSIS — M5136 Other intervertebral disc degeneration, lumbar region: Secondary | ICD-10-CM | POA: Diagnosis not present

## 2015-03-06 DIAGNOSIS — M545 Low back pain: Secondary | ICD-10-CM | POA: Diagnosis not present

## 2015-04-02 DIAGNOSIS — M47817 Spondylosis without myelopathy or radiculopathy, lumbosacral region: Secondary | ICD-10-CM | POA: Diagnosis not present

## 2015-05-20 DIAGNOSIS — Z23 Encounter for immunization: Secondary | ICD-10-CM | POA: Diagnosis not present

## 2015-06-03 ENCOUNTER — Ambulatory Visit: Payer: Medicare Other | Admitting: Allergy and Immunology

## 2015-06-10 ENCOUNTER — Ambulatory Visit (INDEPENDENT_AMBULATORY_CARE_PROVIDER_SITE_OTHER): Payer: Medicare Other | Admitting: Allergy and Immunology

## 2015-06-10 ENCOUNTER — Encounter: Payer: Self-pay | Admitting: Allergy and Immunology

## 2015-06-10 VITALS — BP 120/78 | HR 84 | Resp 20 | Ht 60.0 in

## 2015-06-10 DIAGNOSIS — J4540 Moderate persistent asthma, uncomplicated: Secondary | ICD-10-CM | POA: Diagnosis not present

## 2015-06-10 DIAGNOSIS — J310 Chronic rhinitis: Secondary | ICD-10-CM | POA: Diagnosis not present

## 2015-06-10 DIAGNOSIS — M47816 Spondylosis without myelopathy or radiculopathy, lumbar region: Secondary | ICD-10-CM | POA: Diagnosis not present

## 2015-06-10 MED ORDER — PREDNISONE 1 MG PO TABS: 10.0000 mg | ORAL_TABLET | ORAL | Status: DC

## 2015-06-10 NOTE — Assessment & Plan Note (Addendum)
Currently well controlled.  For now, continue Symbicort 160/4.5 g, 2 inhalations via spacer device twice a day.  Continue montelukast 10 mg daily at bedtime and albuterol HFA, 1-2 inhalations via spacer device every 4-6 hours as needed and 15 minutes prior to exercise/exertion.  Because for upcoming trip to the mountains of [place], prednisone has been provided, 40 mg x3 days, 20 mg x1 day, 10 mg x1 day, then stop.  She is only to start the prednisone if she has an exacerbation while overseas.  He has verbalized understanding and agrees with this plan.

## 2015-06-10 NOTE — Progress Notes (Signed)
History of present illness: HPI Comments: Carla [name] is a 79 y.o. female with asthma, nonallergic rhinitis, and gastroesophageal reflux who presents today for follow up.  She reports that her upper and lower respiratory symptoms have been well-controlled in the interval since her previous visit.  She has not required albuterol rescue, but uses albuterol prior to physical exertion. She will be departing for a 10 day trip to the mountains of [place] and is concerned that she may experience asthma symptoms due to the high altitude and walking.  She reports that she uses Veramyst to control nasal congestion and occasionally adds nasal saline lavage.   Assessment and plan: Moderate persistent asthma Currently well controlled.  For now, continue Symbicort 160/4.5 g, 2 inhalations via spacer device twice a day.  Continue montelukast 10 mg daily at bedtime and albuterol HFA, 1-2 inhalations via spacer device every 4-6 hours as needed and 15 minutes prior to exercise/exertion.  Because for upcoming trip to the mountains of [place], prednisone has been provided, 40 mg x3 days, 20 mg x1 day, 10 mg x1 day, then stop.  She is only to start the prednisone if she has an exacerbation while overseas.  He has verbalized understanding and agrees with this plan.  Chronic rhinitis  Continue Veramyst and nasal saline lavage as needed.    Medications ordered this encounter: Meds ordered this encounter  Medications  . predniSONE (DELTASONE) tablet 10 mg    Sig:     Diagnositics: Spirometry:  Normal ventilatory function, FEV1 of 1.66 L (95% predicted).  Please see scanned spirometry results for details.     Physical examination: Blood pressure 120/78, pulse 84, resp. rate 20, height 5' (1.524 m).  General: Alert, interactive, in no acute distress. HEENT: TMs pearly gray, turbinates mildly edematous without discharge, post-pharynx non erythematous. Neck: Supple without  lymphadenopathy. Lungs: Clear to auscultation without wheezing, rhonchi or rales. CV: Normal S1, S2 without murmurs. Skin: Warm and dry, without lesions or rashes.  The following portions of the patient's history were reviewed and updated as appropriate: allergies, current medications, past family history, past medical history, past social history, past surgical history and problem list.  Outpatient medications:   Medication List       This list is accurate as of: 06/10/15  1:27 PM.  Always use your most recent med list.               amitriptyline 25 MG tablet  Commonly known as:  ELAVIL  Take 25 mg by mouth daily.     aspirin EC 81 MG tablet  Take 1 tablet (81 mg total) by mouth daily.     BENICAR 20 MG tablet  Generic drug:  olmesartan  Take 20 mg by mouth daily.     budesonide-formoterol 160-4.5 MCG/ACT inhaler  Commonly known as:  SYMBICORT  Inhale 2 puffs into the lungs 2 (two) times daily.     famotidine 10 MG tablet  Commonly known as:  PEPCID  Take 10 mg by mouth 2 (two) times daily.     FIBER PO  Take 1 tablet by mouth at bedtime.     flurazepam 15 MG capsule  Commonly known as:  DALMANE  Take 15 mg by mouth 3 (three) times daily.     fluticasone 27.5 MCG/SPRAY nasal spray  Commonly known as:  VERAMYST  Place 2 sprays into the nose daily.     HYDROcodone-homatropine 5-1.5 MG/5ML syrup  Commonly known as:  HYCODAN  Take 5  mLs by mouth every 6 (six) hours as needed for cough.     hydrOXYzine 25 MG tablet  Commonly known as:  ATARAX/VISTARIL  Take 25 mg by mouth at bedtime.     levalbuterol 45 MCG/ACT inhaler  Commonly known as:  XOPENEX HFA  Inhale 2 puffs into the lungs every 4 (four) hours as needed for wheezing.     levothyroxine 100 MCG tablet  Commonly known as:  SYNTHROID, LEVOTHROID  Take 100 mcg by mouth daily before breakfast.     LINZESS 145 MCG Caps capsule  Generic drug:  Linaclotide  Take 145 mcg by mouth at bedtime.      montelukast 10 MG tablet  Commonly known as:  SINGULAIR  Take 10 mg by mouth at bedtime.     moxifloxacin 0.5 % ophthalmic solution  Commonly known as:  VIGAMOX  Place 1 drop into both eyes 3 (three) times daily.     multivitamin with minerals Tabs tablet  Take 1 tablet by mouth daily.     pentosan polysulfate 100 MG capsule  Commonly known as:  ELMIRON  Take 100 mg by mouth 3 (three) times daily.     simvastatin 40 MG tablet  Commonly known as:  ZOCOR  Take 40 mg by mouth at bedtime.        Known medication allergies: Allergies  Allergen Reactions  . Ceclor [Cefaclor]     unknown  . Darvon [Propoxyphene]     unknown  . Doxycycline     unknown    I appreciate the opportunity to take part in this Marciana's care. Please do not hesitate to contact me with questions.  Sincerely,   R. Edgar Frisk, MD

## 2015-06-10 NOTE — Assessment & Plan Note (Signed)
   Continue Veramyst and nasal saline lavage as needed.

## 2015-06-10 NOTE — Patient Instructions (Addendum)
Moderate persistent asthma Currently well controlled.  For now, continue Symbicort 160/4.5 g, 2 inhalations via spacer device twice a day.  Continue montelukast 10 mg daily at bedtime and albuterol HFA, 1-2 inhalations via spacer device every 4-6 hours as needed and 15 minutes prior to exercise/exertion.  Because for upcoming trip to the mountains of [place], prednisone has been provided, 40 mg x3 days, 20 mg x1 day, 10 mg x1 day, then stop.  She is only to start the prednisone if she has an exacerbation while overseas.  He has verbalized understanding and agrees with this plan.   Chronic rhinitis  Continue Veramyst and nasal saline lavage as needed.    Return in about 4 months (around 10/08/2015), or if symptoms worsen or fail to improve.

## 2015-06-11 DIAGNOSIS — H2512 Age-related nuclear cataract, left eye: Secondary | ICD-10-CM | POA: Diagnosis not present

## 2015-06-11 DIAGNOSIS — H2511 Age-related nuclear cataract, right eye: Secondary | ICD-10-CM | POA: Diagnosis not present

## 2015-06-11 DIAGNOSIS — H43813 Vitreous degeneration, bilateral: Secondary | ICD-10-CM | POA: Diagnosis not present

## 2015-06-17 DIAGNOSIS — H2511 Age-related nuclear cataract, right eye: Secondary | ICD-10-CM | POA: Diagnosis not present

## 2015-06-17 DIAGNOSIS — H2512 Age-related nuclear cataract, left eye: Secondary | ICD-10-CM | POA: Diagnosis not present

## 2015-07-07 DIAGNOSIS — J209 Acute bronchitis, unspecified: Secondary | ICD-10-CM | POA: Diagnosis not present

## 2015-07-07 DIAGNOSIS — J309 Allergic rhinitis, unspecified: Secondary | ICD-10-CM | POA: Diagnosis not present

## 2015-07-07 DIAGNOSIS — H10023 Other mucopurulent conjunctivitis, bilateral: Secondary | ICD-10-CM | POA: Diagnosis not present

## 2015-07-07 DIAGNOSIS — R05 Cough: Secondary | ICD-10-CM | POA: Diagnosis not present

## 2015-07-07 DIAGNOSIS — J45909 Unspecified asthma, uncomplicated: Secondary | ICD-10-CM | POA: Diagnosis not present

## 2015-07-07 DIAGNOSIS — I10 Essential (primary) hypertension: Secondary | ICD-10-CM | POA: Diagnosis not present

## 2015-07-07 DIAGNOSIS — Z6833 Body mass index (BMI) 33.0-33.9, adult: Secondary | ICD-10-CM | POA: Diagnosis not present

## 2015-07-14 DIAGNOSIS — H10023 Other mucopurulent conjunctivitis, bilateral: Secondary | ICD-10-CM | POA: Diagnosis not present

## 2015-07-24 DIAGNOSIS — H2511 Age-related nuclear cataract, right eye: Secondary | ICD-10-CM | POA: Diagnosis not present

## 2015-08-05 DIAGNOSIS — Z1389 Encounter for screening for other disorder: Secondary | ICD-10-CM | POA: Diagnosis not present

## 2015-08-05 DIAGNOSIS — K219 Gastro-esophageal reflux disease without esophagitis: Secondary | ICD-10-CM | POA: Diagnosis not present

## 2015-08-05 DIAGNOSIS — J309 Allergic rhinitis, unspecified: Secondary | ICD-10-CM | POA: Diagnosis not present

## 2015-08-05 DIAGNOSIS — J45909 Unspecified asthma, uncomplicated: Secondary | ICD-10-CM | POA: Diagnosis not present

## 2015-08-05 DIAGNOSIS — N3010 Interstitial cystitis (chronic) without hematuria: Secondary | ICD-10-CM | POA: Diagnosis not present

## 2015-08-05 DIAGNOSIS — N183 Chronic kidney disease, stage 3 (moderate): Secondary | ICD-10-CM | POA: Diagnosis not present

## 2015-08-05 DIAGNOSIS — E784 Other hyperlipidemia: Secondary | ICD-10-CM | POA: Diagnosis not present

## 2015-08-05 DIAGNOSIS — Z6833 Body mass index (BMI) 33.0-33.9, adult: Secondary | ICD-10-CM | POA: Diagnosis not present

## 2015-08-05 DIAGNOSIS — M109 Gout, unspecified: Secondary | ICD-10-CM | POA: Diagnosis not present

## 2015-08-05 DIAGNOSIS — I10 Essential (primary) hypertension: Secondary | ICD-10-CM | POA: Diagnosis not present

## 2015-08-05 DIAGNOSIS — E038 Other specified hypothyroidism: Secondary | ICD-10-CM | POA: Diagnosis not present

## 2015-08-26 DIAGNOSIS — J45998 Other asthma: Secondary | ICD-10-CM | POA: Diagnosis not present

## 2015-08-26 DIAGNOSIS — J309 Allergic rhinitis, unspecified: Secondary | ICD-10-CM | POA: Diagnosis not present

## 2015-08-26 DIAGNOSIS — R062 Wheezing: Secondary | ICD-10-CM | POA: Diagnosis not present

## 2015-08-26 DIAGNOSIS — K589 Irritable bowel syndrome without diarrhea: Secondary | ICD-10-CM | POA: Diagnosis not present

## 2015-08-26 DIAGNOSIS — K219 Gastro-esophageal reflux disease without esophagitis: Secondary | ICD-10-CM | POA: Diagnosis not present

## 2015-08-26 DIAGNOSIS — J209 Acute bronchitis, unspecified: Secondary | ICD-10-CM | POA: Diagnosis not present

## 2015-08-26 DIAGNOSIS — R05 Cough: Secondary | ICD-10-CM | POA: Diagnosis not present

## 2015-08-26 DIAGNOSIS — I10 Essential (primary) hypertension: Secondary | ICD-10-CM | POA: Diagnosis not present

## 2015-09-01 ENCOUNTER — Ambulatory Visit: Payer: Medicare Other | Admitting: Allergy and Immunology

## 2015-09-22 ENCOUNTER — Ambulatory Visit (INDEPENDENT_AMBULATORY_CARE_PROVIDER_SITE_OTHER): Payer: Medicare Other | Admitting: Allergy and Immunology

## 2015-09-22 ENCOUNTER — Encounter: Payer: Self-pay | Admitting: Allergy and Immunology

## 2015-09-22 VITALS — BP 128/76 | HR 80 | Resp 18

## 2015-09-22 DIAGNOSIS — K219 Gastro-esophageal reflux disease without esophagitis: Secondary | ICD-10-CM

## 2015-09-22 DIAGNOSIS — J310 Chronic rhinitis: Secondary | ICD-10-CM | POA: Diagnosis not present

## 2015-09-22 DIAGNOSIS — J45901 Unspecified asthma with (acute) exacerbation: Secondary | ICD-10-CM

## 2015-09-22 MED ORDER — BECLOMETHASONE DIPROPIONATE 80 MCG/ACT IN AERS: INHALATION_SPRAY | RESPIRATORY_TRACT | Status: DC

## 2015-09-22 MED ORDER — PREDNISONE 1 MG PO TABS: 10.0000 mg | ORAL_TABLET | ORAL | Status: DC

## 2015-09-22 NOTE — Progress Notes (Signed)
Follow-up Note  RE: Carla [name] MRN: [id number] DOB: 1935/10/15 Date of Office Visit: 09/22/2015  Primary care provider: Tivis Ringer, MD Referring provider: Prince Solian, MD  History of present illness: HPI Comments: Carla [name] is a 80 y.o. female with persistent asthma, nonallergic rhinitis, and gastroesophageal reflux who presents today for first visit.  She reports that she had an asthma exacerbation requiring prednisone while on trip to [place] in November.  She reports that her lower respiratory symptoms have persisted since that time.  Since early December 2016, she has seen her primary care physician 3 times receiving prednisone and antibiotics.  Initially, she reports having had discolored mucus production.  Her chest x-ray was negative.  On each occasion, after having received prednisone and Levaquin, she experienced temporary but unsustained symptom relief.  She reports that over the past week she has required albuterol rescue for coughing and wheezing multiple times per day.  She denies fevers, chills, discolored mucus production, and nocturnal awakening due to lower respiratory symptoms.  She is currently taking Symbicort 160/4.5 g, 2 inhalations via spacer device twice a day.  She denies significant nasal or sinus symptoms with the exception of some thick postnasal drainage.  She experiences occasional reflux symptoms but does not take her proton pump inhibitor on a daily basis as recommended due to concerns about potential side effects.   Assessment and plan: Asthma with acute exacerbation  Prednisone has been provided, 40 mg x3 days, 20 mg x1 day, 10 mg x1 day, then stop.  Continue Symbicort 160/4.5 g, 2 inhalations via spacer device twice a day.  During respiratory tract infections and asthma flares, Carla [name] may add Qvar 80 g, 2 inhalations via spacer device twice a day until symptoms have returned to baseline.  A sample and prescription have been  provided.  Continue levalbuterol HFA, 1-2 inhalations every 4-6 hours as needed.  Carla [name] has been asked to contact me if her symptoms persist or progress. Otherwise, she may return for follow up in 4 months.  Chronic rhinitis  Continue fluticasone nasal spray and nasal saline irrigation as needed.  GERD (gastroesophageal reflux disease)  Continue appropriate lifestyle modifications, proton pump inhibitor as prescribed, and famotidine as directed.    Meds ordered this encounter  Medications  . beclomethasone (QVAR) 80 MCG/ACT inhaler    Sig: Two puffs twice daily with asthma flare.  Rinse, gargle and spit with water after use.  Use with a spacer.    Dispense:  1 Inhaler    Refill:  2  . predniSONE (DELTASONE) tablet 10 mg    Sig:     Diagnositics: Spirometry reveals an FVC of 1.04 L and an FEV1 of 0.89 L (51% predicted) with significant (260 mL, 29%) post bronchodilator improvement.    Physical examination: Blood pressure 128/76, pulse 80, resp. rate 18.  General: Alert, interactive, in no acute distress. HEENT: TMs pearly gray, turbinates moderately edematous without discharge, post-pharynx erythematous. Neck: Supple without lymphadenopathy. Lungs: Mildly decreased breath sounds bilaterally without wheezing, rhonchi or rales. CV: Normal S1, S2 without murmurs. Skin: Warm and dry, without lesions or rashes.  The following portions of the patient's history were reviewed and updated as appropriate: allergies, current medications, past family history, past medical history, past social history, past surgical history and problem list.    Medication List       This list is accurate as of: 09/22/15  1:31 PM.  Always use your most recent med list.  amitriptyline 25 MG tablet  Commonly known as:  ELAVIL  Take 25 mg by mouth daily.     aspirin EC 81 MG tablet  Take 1 tablet (81 mg total) by mouth daily.     beclomethasone 80 MCG/ACT inhaler  Commonly known  as:  QVAR  Two puffs twice daily with asthma flare.  Rinse, gargle and spit with water after use.  Use with a spacer.     BENICAR 20 MG tablet  Generic drug:  olmesartan  Take 20 mg by mouth daily.     budesonide-formoterol 160-4.5 MCG/ACT inhaler  Commonly known as:  SYMBICORT  Inhale 2 puffs into the lungs 2 (two) times daily.     famotidine 10 MG tablet  Commonly known as:  PEPCID  Take 10 mg by mouth 2 (two) times daily.     FIBER PO  Take 1 tablet by mouth at bedtime.     flurazepam 15 MG capsule  Commonly known as:  DALMANE  Take 15 mg by mouth 3 (three) times daily.     fluticasone 27.5 MCG/SPRAY nasal spray  Commonly known as:  VERAMYST  Place 2 sprays into the nose daily.     HYDROcodone-homatropine 5-1.5 MG/5ML syrup  Commonly known as:  HYCODAN  Take 5 mLs by mouth every 6 (six) hours as needed for cough. Reported on 09/22/2015     hydrOXYzine 25 MG tablet  Commonly known as:  ATARAX/VISTARIL  Take 25 mg by mouth at bedtime.     levalbuterol 45 MCG/ACT inhaler  Commonly known as:  XOPENEX HFA  Inhale 2 puffs into the lungs every 4 (four) hours as needed for wheezing.     levothyroxine 100 MCG tablet  Commonly known as:  SYNTHROID, LEVOTHROID  Take 100 mcg by mouth daily before breakfast.     LINZESS 145 MCG Caps capsule  Generic drug:  Linaclotide  Take 145 mcg by mouth at bedtime.     montelukast 10 MG tablet  Commonly known as:  SINGULAIR  Take 10 mg by mouth at bedtime. Reported on 09/22/2015     moxifloxacin 0.5 % ophthalmic solution  Commonly known as:  VIGAMOX  Place 1 drop into both eyes 3 (three) times daily.     multivitamin with minerals Tabs tablet  Take 1 tablet by mouth daily.     pentosan polysulfate 100 MG capsule  Commonly known as:  ELMIRON  Take 100 mg by mouth 3 (three) times daily.     simvastatin 40 MG tablet  Commonly known as:  ZOCOR  Take 40 mg by mouth at bedtime.        Allergies  Allergen Reactions  . Ceclor  [Cefaclor]     unknown  . Darvon [Propoxyphene]     unknown  . Doxycycline     unknown   Review of systems: Constitutional: Negative for fever, chills and weight loss.  HENT: Negative for nosebleeds.   Positive for thick postnasal drainage. Eyes: Negative for blurred vision.  Respiratory: Negative for hemoptysis.   Positive for coughing, dyspnea, wheezing. Cardiovascular: Negative for chest pain.  Gastrointestinal: Negative for diarrhea and constipation.  Genitourinary: Negative for dysuria.  Musculoskeletal: Negative for myalgias and joint pain.  Neurological: Negative for dizziness.  Endo/Heme/Allergies: Does not bruise/bleed easily.   Past Medical History  Diagnosis Date  . Arthritis   . IBS (irritable bowel syndrome)   . Interstitial cystitis   . Asthma   . High cholesterol   . Hypertension  No family history on file.  Social History   Social History  . Marital Status: Married    Spouse Name: N/A  . Number of Children: N/A  . Years of Education: N/A   Occupational History  . Not on file.   Social History Main Topics  . Smoking status: Never Smoker   . Smokeless tobacco: Not on file  . Alcohol Use: Yes  . Drug Use: Not on file  . Sexual Activity: Not on file   Other Topics Concern  . Not on file   Social History Narrative    I appreciate the opportunity to take part in this Carla [name]'s care. Please do not hesitate to contact me with questions.  Sincerely,   R. Edgar Frisk, MD

## 2015-09-22 NOTE — Patient Instructions (Signed)
Asthma with acute exacerbation  Prednisone has been provided, 40 mg x3 days, 20 mg x1 day, 10 mg x1 day, then stop.  Continue Symbicort 160/4.5 g, 2 inhalations via spacer device twice a day.  During respiratory tract infections and asthma flares, Donnajean may add Qvar 80 g, 2 inhalations via spacer device twice a day until symptoms have returned to baseline.  A sample and prescription have been provided.  Continue levalbuterol HFA, 1-2 inhalations every 4-6 hours as needed.  Caye has been asked to contact me if her symptoms persist or progress. Otherwise, she may return for follow up in 4 months.  Chronic rhinitis  Continue fluticasone nasal spray and nasal saline irrigation as needed.  GERD (gastroesophageal reflux disease)  Continue appropriate lifestyle modifications, proton pump inhibitor as prescribed, and famotidine as directed.   Return in about 4 months (around 01/20/2016), or if symptoms worsen or fail to improve.

## 2015-09-22 NOTE — Assessment & Plan Note (Signed)
   Continue fluticasone nasal spray and nasal saline irrigation as needed.

## 2015-09-22 NOTE — Assessment & Plan Note (Signed)
   Prednisone has been provided, 40 mg x3 days, 20 mg x1 day, 10 mg x1 day, then stop.  Continue Symbicort 160/4.5 g, 2 inhalations via spacer device twice a day.  During respiratory tract infections and asthma flares, Silka may add Qvar 80 g, 2 inhalations via spacer device twice a day until symptoms have returned to baseline.  A sample and prescription have been provided.  Continue levalbuterol HFA, 1-2 inhalations every 4-6 hours as needed.  Carla [name] has been asked to contact me if her symptoms persist or progress. Otherwise, she may return for follow up in 4 months.

## 2015-09-22 NOTE — Assessment & Plan Note (Signed)
   Continue appropriate lifestyle modifications, proton pump inhibitor as prescribed, and famotidine as directed.

## 2015-09-23 ENCOUNTER — Other Ambulatory Visit: Payer: Self-pay

## 2015-09-23 MED ORDER — FLUTICASONE PROPIONATE HFA 110 MCG/ACT IN AERO: 2.0000 | INHALATION_SPRAY | Freq: Two times a day (BID) | RESPIRATORY_TRACT | Status: DC | PRN

## 2015-09-23 NOTE — Telephone Encounter (Signed)
Qvar not in insurance companies formulary. Flovent ordered per Dr. Verlin Fester

## 2015-09-25 ENCOUNTER — Encounter: Payer: Self-pay | Admitting: *Deleted

## 2015-10-13 ENCOUNTER — Telehealth: Payer: Self-pay

## 2015-10-13 NOTE — Telephone Encounter (Signed)
Spoke with patient advised Qvar is not covered on formulary so we had to switch to Flovent. Patient verbalizes understanding states Qvar really helped her

## 2015-10-13 NOTE — Telephone Encounter (Signed)
Patient has a few questions for one of Dr. Barnett Hatter nurses. She was given a sample of qvar, but flovent was sent to the pharmacy. Patient is wondering are these the same medications or what is the difference.  One of the medications we sent in isn't covered by [facility], but she can not remember what the medication is.  Please Advise  Patient seen on 09/22/15 by Dr. Verlin Fester

## 2015-10-15 DIAGNOSIS — S0081XA Abrasion of other part of head, initial encounter: Secondary | ICD-10-CM | POA: Diagnosis not present

## 2015-10-15 DIAGNOSIS — Z6833 Body mass index (BMI) 33.0-33.9, adult: Secondary | ICD-10-CM | POA: Diagnosis not present

## 2015-10-15 DIAGNOSIS — W010XXS Fall on same level from slipping, tripping and stumbling without subsequent striking against object, sequela: Secondary | ICD-10-CM | POA: Diagnosis not present

## 2015-10-20 DIAGNOSIS — Z6833 Body mass index (BMI) 33.0-33.9, adult: Secondary | ICD-10-CM | POA: Diagnosis not present

## 2015-10-20 DIAGNOSIS — M109 Gout, unspecified: Secondary | ICD-10-CM | POA: Diagnosis not present

## 2015-10-20 DIAGNOSIS — J45909 Unspecified asthma, uncomplicated: Secondary | ICD-10-CM | POA: Diagnosis not present

## 2015-10-20 DIAGNOSIS — S0081XA Abrasion of other part of head, initial encounter: Secondary | ICD-10-CM | POA: Diagnosis not present

## 2015-10-20 DIAGNOSIS — L719 Rosacea, unspecified: Secondary | ICD-10-CM | POA: Diagnosis not present

## 2015-10-20 DIAGNOSIS — I10 Essential (primary) hypertension: Secondary | ICD-10-CM | POA: Diagnosis not present

## 2015-10-20 DIAGNOSIS — J309 Allergic rhinitis, unspecified: Secondary | ICD-10-CM | POA: Diagnosis not present

## 2015-12-01 DIAGNOSIS — Z1231 Encounter for screening mammogram for malignant neoplasm of breast: Secondary | ICD-10-CM | POA: Diagnosis not present

## 2015-12-08 DIAGNOSIS — H43813 Vitreous degeneration, bilateral: Secondary | ICD-10-CM | POA: Diagnosis not present

## 2015-12-08 DIAGNOSIS — H2512 Age-related nuclear cataract, left eye: Secondary | ICD-10-CM | POA: Diagnosis not present

## 2015-12-08 DIAGNOSIS — Z961 Presence of intraocular lens: Secondary | ICD-10-CM | POA: Diagnosis not present

## 2016-01-09 DIAGNOSIS — H608X3 Other otitis externa, bilateral: Secondary | ICD-10-CM | POA: Diagnosis not present

## 2016-01-09 DIAGNOSIS — H900 Conductive hearing loss, bilateral: Secondary | ICD-10-CM | POA: Diagnosis not present

## 2016-01-15 DIAGNOSIS — G8929 Other chronic pain: Secondary | ICD-10-CM | POA: Diagnosis not present

## 2016-01-15 DIAGNOSIS — M25511 Pain in right shoulder: Secondary | ICD-10-CM | POA: Diagnosis not present

## 2016-01-15 DIAGNOSIS — M47816 Spondylosis without myelopathy or radiculopathy, lumbar region: Secondary | ICD-10-CM | POA: Diagnosis not present

## 2016-01-15 DIAGNOSIS — M1288 Other specific arthropathies, not elsewhere classified, other specified site: Secondary | ICD-10-CM | POA: Diagnosis not present

## 2016-01-15 DIAGNOSIS — M545 Low back pain: Secondary | ICD-10-CM | POA: Diagnosis not present

## 2016-01-28 DIAGNOSIS — N390 Urinary tract infection, site not specified: Secondary | ICD-10-CM | POA: Diagnosis not present

## 2016-01-28 DIAGNOSIS — E038 Other specified hypothyroidism: Secondary | ICD-10-CM | POA: Diagnosis not present

## 2016-01-28 DIAGNOSIS — M859 Disorder of bone density and structure, unspecified: Secondary | ICD-10-CM | POA: Diagnosis not present

## 2016-01-28 DIAGNOSIS — M109 Gout, unspecified: Secondary | ICD-10-CM | POA: Diagnosis not present

## 2016-01-28 DIAGNOSIS — E784 Other hyperlipidemia: Secondary | ICD-10-CM | POA: Diagnosis not present

## 2016-01-28 DIAGNOSIS — R8299 Other abnormal findings in urine: Secondary | ICD-10-CM | POA: Diagnosis not present

## 2016-01-28 DIAGNOSIS — I10 Essential (primary) hypertension: Secondary | ICD-10-CM | POA: Diagnosis not present

## 2016-01-30 DIAGNOSIS — M8580 Other specified disorders of bone density and structure, unspecified site: Secondary | ICD-10-CM | POA: Diagnosis not present

## 2016-01-30 DIAGNOSIS — J309 Allergic rhinitis, unspecified: Secondary | ICD-10-CM | POA: Diagnosis not present

## 2016-01-30 DIAGNOSIS — L719 Rosacea, unspecified: Secondary | ICD-10-CM | POA: Diagnosis not present

## 2016-01-30 DIAGNOSIS — M109 Gout, unspecified: Secondary | ICD-10-CM | POA: Diagnosis not present

## 2016-01-30 DIAGNOSIS — N3010 Interstitial cystitis (chronic) without hematuria: Secondary | ICD-10-CM | POA: Diagnosis not present

## 2016-01-30 DIAGNOSIS — Z0000 Encounter for general adult medical examination without abnormal findings: Secondary | ICD-10-CM | POA: Diagnosis not present

## 2016-01-30 DIAGNOSIS — N183 Chronic kidney disease, stage 3 (moderate): Secondary | ICD-10-CM | POA: Diagnosis not present

## 2016-01-30 DIAGNOSIS — J45909 Unspecified asthma, uncomplicated: Secondary | ICD-10-CM | POA: Diagnosis not present

## 2016-01-30 DIAGNOSIS — E784 Other hyperlipidemia: Secondary | ICD-10-CM | POA: Diagnosis not present

## 2016-01-30 DIAGNOSIS — I10 Essential (primary) hypertension: Secondary | ICD-10-CM | POA: Diagnosis not present

## 2016-01-30 DIAGNOSIS — K219 Gastro-esophageal reflux disease without esophagitis: Secondary | ICD-10-CM | POA: Diagnosis not present

## 2016-01-30 DIAGNOSIS — Z1389 Encounter for screening for other disorder: Secondary | ICD-10-CM | POA: Diagnosis not present

## 2016-02-10 DIAGNOSIS — M47816 Spondylosis without myelopathy or radiculopathy, lumbar region: Secondary | ICD-10-CM | POA: Diagnosis not present

## 2016-02-13 DIAGNOSIS — H900 Conductive hearing loss, bilateral: Secondary | ICD-10-CM | POA: Diagnosis not present

## 2016-02-13 DIAGNOSIS — H6122 Impacted cerumen, left ear: Secondary | ICD-10-CM | POA: Diagnosis not present

## 2016-04-27 DIAGNOSIS — M47816 Spondylosis without myelopathy or radiculopathy, lumbar region: Secondary | ICD-10-CM | POA: Diagnosis not present

## 2016-04-27 DIAGNOSIS — S134XXD Sprain of ligaments of cervical spine, subsequent encounter: Secondary | ICD-10-CM | POA: Diagnosis not present

## 2016-05-18 DIAGNOSIS — M47816 Spondylosis without myelopathy or radiculopathy, lumbar region: Secondary | ICD-10-CM | POA: Diagnosis not present

## 2016-05-26 DIAGNOSIS — M47816 Spondylosis without myelopathy or radiculopathy, lumbar region: Secondary | ICD-10-CM | POA: Diagnosis not present

## 2016-05-27 DIAGNOSIS — M5136 Other intervertebral disc degeneration, lumbar region: Secondary | ICD-10-CM | POA: Diagnosis not present

## 2016-05-27 DIAGNOSIS — S134XXA Sprain of ligaments of cervical spine, initial encounter: Secondary | ICD-10-CM | POA: Diagnosis not present

## 2016-05-27 DIAGNOSIS — M47816 Spondylosis without myelopathy or radiculopathy, lumbar region: Secondary | ICD-10-CM | POA: Diagnosis not present

## 2016-06-02 DIAGNOSIS — M542 Cervicalgia: Secondary | ICD-10-CM | POA: Diagnosis not present

## 2016-06-09 DIAGNOSIS — M542 Cervicalgia: Secondary | ICD-10-CM | POA: Diagnosis not present

## 2016-06-09 DIAGNOSIS — G8929 Other chronic pain: Secondary | ICD-10-CM | POA: Diagnosis not present

## 2016-06-12 DIAGNOSIS — Z23 Encounter for immunization: Secondary | ICD-10-CM | POA: Diagnosis not present

## 2016-06-16 DIAGNOSIS — M47816 Spondylosis without myelopathy or radiculopathy, lumbar region: Secondary | ICD-10-CM | POA: Diagnosis not present

## 2016-06-23 DIAGNOSIS — S134XXS Sprain of ligaments of cervical spine, sequela: Secondary | ICD-10-CM | POA: Diagnosis not present

## 2016-06-23 DIAGNOSIS — M47816 Spondylosis without myelopathy or radiculopathy, lumbar region: Secondary | ICD-10-CM | POA: Diagnosis not present

## 2016-06-23 DIAGNOSIS — M5136 Other intervertebral disc degeneration, lumbar region: Secondary | ICD-10-CM | POA: Diagnosis not present

## 2016-07-26 ENCOUNTER — Encounter (HOSPITAL_COMMUNITY): Payer: Self-pay | Admitting: Emergency Medicine

## 2016-07-26 ENCOUNTER — Ambulatory Visit (INDEPENDENT_AMBULATORY_CARE_PROVIDER_SITE_OTHER): Payer: Medicare Other

## 2016-07-26 ENCOUNTER — Ambulatory Visit (HOSPITAL_COMMUNITY)
Admission: EM | Admit: 2016-07-26 | Discharge: 2016-07-26 | Disposition: A | Discharge status: Home / Self Care | Payer: Medicare Other | Attending: Emergency Medicine | Admitting: Emergency Medicine

## 2016-07-26 DIAGNOSIS — B9789 Other viral agents as the cause of diseases classified elsewhere: Secondary | ICD-10-CM

## 2016-07-26 DIAGNOSIS — R05 Cough: Secondary | ICD-10-CM | POA: Diagnosis not present

## 2016-07-26 DIAGNOSIS — J069 Acute upper respiratory infection, unspecified: Secondary | ICD-10-CM | POA: Diagnosis not present

## 2016-07-26 LAB — POCT URINALYSIS DIP (DEVICE)
Bilirubin Urine: NEGATIVE
Glucose, UA: NEGATIVE mg/dL
Hgb urine dipstick: NEGATIVE
Ketones, ur: NEGATIVE mg/dL
Leukocytes, UA: NEGATIVE
Nitrite: NEGATIVE
Protein, ur: NEGATIVE mg/dL
Specific Gravity, Urine: 1.02 (ref 1.005–1.030)
Urobilinogen, UA: 0.2 mg/dL (ref 0.0–1.0)
pH: 5.5 (ref 5.0–8.0)

## 2016-07-26 MED ORDER — LEVALBUTEROL TARTRATE 45 MCG/ACT IN AERO: 2.0000 | INHALATION_SPRAY | RESPIRATORY_TRACT | 0 refills | Status: AC | PRN

## 2016-07-26 NOTE — Discharge Instructions (Signed)
Your x-rays negative for pneumonia. There is no sign of infection in your urine. Your presentation is not consistent with the flu.  You have a nasty virus. Get plenty of rest and drink lots of fluids. Continue your asthma medications as prescribed. Do nasal saline rinses twice a day. The fever should resolve within the next 2 days. If things are getting worse or are just not improving, please come back.

## 2016-07-26 NOTE — ED Triage Notes (Signed)
The patient presented to the [facility] with a complaint of a cough and congestion x 2 days.

## 2016-07-26 NOTE — ED Provider Notes (Signed)
[facility]    CSN: [id number] Arrival date & time: 07/26/16  1412     History   Chief Complaint Chief Complaint  Patient presents with  . Cough    HPI Carla [name] is a 82 y.o. female.   HPI  She is 81 year old woman here for evaluation of cough. Her symptoms started yesterday afternoon with a cough. Today, she reports continued cough. She had a fever of 101 earlier today. This was after taking Tylenol. She also reports feeling listless and decreased energy. She also reports feeling short of breath. No nasal symptoms. No sore throat. No nausea or vomiting. She has been going to the hospital and [facility] rehabilitation to visit her husband frequently over the last month. She did saline rinse yesterday and this morning which seemed to help a little bit.  She does have asthma. She is currently out of her rescue inhaler. She reports some wheezing last night, but none today.  Her daughter would like her checked for urinary tract infection as well as pneumonia. She reports some chronic urinary symptoms such as incomplete emptying, and weakened stream that she attributes to likely bladder prolapse. Denies dysuria, urgency, or frequency.  Past Medical History:  Diagnosis Date  . Arthritis   . Asthma   . High cholesterol   . Hypertension   . IBS (irritable bowel syndrome)   . Interstitial cystitis     Patient Active Problem List   Diagnosis Date Noted  . Asthma with acute exacerbation 09/22/2015  . GERD (gastroesophageal reflux disease) 09/22/2015  . Moderate persistent asthma 06/10/2015  . Chronic rhinitis 06/10/2015  . Near syncope 10/12/2013  . Dizziness 10/12/2013  . Meningioma ([place]) 10/12/2013  . Irritable bowel syndrome 10/12/2013  . Frequent PVCs 10/12/2013  . Hypotension, unspecified 10/12/2013  . Acute kidney injury ([place]) 10/12/2013    Past Surgical History:  Procedure Laterality Date  . CHOLECYSTECTOMY      OB History    No data available         Home Medications    Prior to Admission medications   Medication Sig Start Date End Date Taking? Authorizing Provider  amitriptyline (ELAVIL) 25 MG tablet Take 25 mg by mouth daily.   Yes Historical Provider, MD  budesonide-formoterol (SYMBICORT) 160-4.5 MCG/ACT inhaler Inhale 2 puffs into the lungs 2 (two) times daily.   Yes Historical Provider, MD  flurazepam (DALMANE) 15 MG capsule Take 15 mg by mouth 3 (three) times daily.   Yes Historical Provider, MD  fluticasone (FLOVENT HFA) 110 MCG/ACT inhaler Inhale 2 puffs into the lungs 2 (two) times daily as needed (For asthma flares). 09/23/15  Yes Adelina Mings, MD  fluticasone (VERAMYST) 27.5 MCG/SPRAY nasal spray Place 2 sprays into the nose daily.   Yes Historical Provider, MD  levothyroxine (SYNTHROID, LEVOTHROID) 100 MCG tablet Take 100 mcg by mouth daily before breakfast.   Yes Historical Provider, MD  montelukast (SINGULAIR) 10 MG tablet Take 10 mg by mouth at bedtime. Reported on 09/22/2015   Yes Historical Provider, MD  Multiple Vitamin (MULTIVITAMIN WITH MINERALS) TABS tablet Take 1 tablet by mouth daily.   Yes Historical Provider, MD  olmesartan (BENICAR) 20 MG tablet Take 20 mg by mouth daily.   Yes Historical Provider, MD  simvastatin (ZOCOR) 40 MG tablet Take 40 mg by mouth at bedtime.   Yes Historical Provider, MD  famotidine (PEPCID) 10 MG tablet Take 10 mg by mouth 2 (two) times daily.    Historical Provider,  MD  FIBER PO Take 1 tablet by mouth at bedtime.    Historical Provider, MD  HYDROcodone-homatropine (HYCODAN) 5-1.5 MG/5ML syrup Take 5 mLs by mouth every 6 (six) hours as needed for cough. Reported on 09/22/2015    Historical Provider, MD  hydrOXYzine (ATARAX/VISTARIL) 25 MG tablet Take 25 mg by mouth at bedtime.    Historical Provider, MD  levalbuterol [facility] HFA) 45 MCG/ACT inhaler Inhale 2 puffs into the lungs every 4 (four) hours as needed for wheezing. 07/26/16   Melony Overly, MD  Linaclotide (LINZESS) 145  MCG CAPS capsule Take 145 mcg by mouth at bedtime.    Historical Provider, MD  moxifloxacin (VIGAMOX) 0.5 % ophthalmic solution Place 1 drop into both eyes 3 (three) times daily.    Historical Provider, MD  pentosan polysulfate (ELMIRON) 100 MG capsule Take 100 mg by mouth 3 (three) times daily.    Historical Provider, MD    Family History History reviewed. No pertinent family history.  Social History Social History  Substance Use Topics  . Smoking status: Never Smoker  . Smokeless tobacco: Not on file  . Alcohol use Yes     Allergies   Ceclor [cefaclor]; Darvon [propoxyphene]; and Doxycycline   Review of Systems Review of Systems As in history of present illness  Physical Exam Triage Vital Signs ED Triage Vitals  Enc Vitals Group     BP 07/26/16 1456 130/96     Pulse Rate 07/26/16 1456 110     Resp 07/26/16 1456 20     Temp 07/26/16 1456 99.9 F (37.7 C)     Temp Source 07/26/16 1456 Oral     SpO2 07/26/16 1456 94 %     Weight --      Height --      Head Circumference --      Peak Flow --      Pain Score 07/26/16 1501 0     Pain Loc --      Pain Edu? --      Excl. in [place]? --    No data found.   Updated Vital Signs BP 130/96 (BP Location: Right Arm)   Pulse 110   Temp 99.9 F (37.7 C) (Oral)   Resp 20   SpO2 94%   Visual Acuity Right Eye Distance:   Left Eye Distance:   Bilateral Distance:    Right Eye Near:   Left Eye Near:    Bilateral Near:     Physical Exam  Constitutional: She is oriented to person, place, and time. She appears well-developed and well-nourished. No distress.  Neck: Neck supple.  Cardiovascular: Normal rate, regular rhythm and normal heart sounds.   No murmur heard. Pulmonary/Chest: Effort normal. No respiratory distress. She has no wheezes. She has rales (faint crackles in right lung base).  Neurological: She is alert and oriented to person, place, and time.     UC Treatments / Results  Labs (all labs ordered are  listed, but only abnormal results are displayed) Labs Reviewed  POCT URINALYSIS DIP (DEVICE)    EKG  EKG Interpretation None       Radiology Dg Chest 2 View  Result Date: 07/26/2016 CLINICAL DATA:  History cough since yesterday with fatigue and dyspnea. EXAM: CHEST  2 VIEW COMPARISON:  09/24/2013 CXR FINDINGS: Chronic mild elevation of the right hemidiaphragm. Bibasilar atelectasis. No pneumonic consolidation, CHF, effusion or pneumothorax. Heart is normal in size. There is aortic atherosclerosis. No suspicious osseous lesions. Cholecystectomy. IMPRESSION: 1.  No active cardiopulmonary disease. 2. Chronic elevation of right hemidiaphragm. 3. Bibasilar atelectasis. Electronically Signed   By: Ashley Royalty M.D.   On: 07/26/2016 15:42    Procedures Procedures (including critical care time)  Medications Ordered in UC Medications - No data to display   Initial Impression / Assessment and Plan / UC Course  I have reviewed the triage vital signs and the nursing notes.  Pertinent labs & imaging results that were available during my care of the patient were reviewed by me and considered in my medical decision making (see chart for details).  Clinical Course     X-ray and urine negative. Clinical presentation not consistent with flu. Likely viral illness. Continue asthma medications as prescribed. I did provide a refill of her Xopenex. Return precautions reviewed.  Final Clinical Impressions(s) / UC Diagnoses   Final diagnoses:  Viral URI with cough    New Prescriptions Discharge Medication List as of 07/26/2016  3:50 PM       Melony Overly, MD 07/26/16 [phone]

## 2016-07-28 DIAGNOSIS — N183 Chronic kidney disease, stage 3 (moderate): Secondary | ICD-10-CM | POA: Diagnosis not present

## 2016-07-28 DIAGNOSIS — J45909 Unspecified asthma, uncomplicated: Secondary | ICD-10-CM | POA: Diagnosis not present

## 2016-07-28 DIAGNOSIS — M109 Gout, unspecified: Secondary | ICD-10-CM | POA: Diagnosis not present

## 2016-07-28 DIAGNOSIS — M419 Scoliosis, unspecified: Secondary | ICD-10-CM | POA: Diagnosis not present

## 2016-07-28 DIAGNOSIS — N3010 Interstitial cystitis (chronic) without hematuria: Secondary | ICD-10-CM | POA: Diagnosis not present

## 2016-07-28 DIAGNOSIS — J209 Acute bronchitis, unspecified: Secondary | ICD-10-CM | POA: Diagnosis not present

## 2016-07-28 DIAGNOSIS — I10 Essential (primary) hypertension: Secondary | ICD-10-CM | POA: Diagnosis not present

## 2016-07-28 DIAGNOSIS — J309 Allergic rhinitis, unspecified: Secondary | ICD-10-CM | POA: Diagnosis not present

## 2016-07-28 DIAGNOSIS — K589 Irritable bowel syndrome without diarrhea: Secondary | ICD-10-CM | POA: Diagnosis not present

## 2016-07-28 DIAGNOSIS — R509 Fever, unspecified: Secondary | ICD-10-CM | POA: Diagnosis not present

## 2016-07-28 DIAGNOSIS — M859 Disorder of bone density and structure, unspecified: Secondary | ICD-10-CM | POA: Diagnosis not present

## 2016-07-28 DIAGNOSIS — Z6832 Body mass index (BMI) 32.0-32.9, adult: Secondary | ICD-10-CM | POA: Diagnosis not present

## 2016-08-18 DIAGNOSIS — Z01419 Encounter for gynecological examination (general) (routine) without abnormal findings: Secondary | ICD-10-CM | POA: Diagnosis not present

## 2016-08-18 DIAGNOSIS — N393 Stress incontinence (female) (male): Secondary | ICD-10-CM | POA: Diagnosis not present

## 2016-08-18 DIAGNOSIS — N952 Postmenopausal atrophic vaginitis: Secondary | ICD-10-CM | POA: Diagnosis not present

## 2016-08-18 DIAGNOSIS — N8111 Cystocele, midline: Secondary | ICD-10-CM | POA: Diagnosis not present

## 2016-08-18 DIAGNOSIS — N816 Rectocele: Secondary | ICD-10-CM | POA: Diagnosis not present

## 2016-08-18 DIAGNOSIS — Z124 Encounter for screening for malignant neoplasm of cervix: Secondary | ICD-10-CM | POA: Diagnosis not present

## 2016-08-25 DIAGNOSIS — M5136 Other intervertebral disc degeneration, lumbar region: Secondary | ICD-10-CM | POA: Diagnosis not present

## 2016-08-25 DIAGNOSIS — M47816 Spondylosis without myelopathy or radiculopathy, lumbar region: Secondary | ICD-10-CM | POA: Diagnosis not present

## 2016-08-25 DIAGNOSIS — S134XXS Sprain of ligaments of cervical spine, sequela: Secondary | ICD-10-CM | POA: Diagnosis not present

## 2016-09-01 DIAGNOSIS — N393 Stress incontinence (female) (male): Secondary | ICD-10-CM | POA: Diagnosis not present

## 2016-09-01 DIAGNOSIS — N8111 Cystocele, midline: Secondary | ICD-10-CM | POA: Diagnosis not present

## 2016-09-01 DIAGNOSIS — H6123 Impacted cerumen, bilateral: Secondary | ICD-10-CM | POA: Diagnosis not present

## 2016-09-01 DIAGNOSIS — N816 Rectocele: Secondary | ICD-10-CM | POA: Diagnosis not present

## 2016-09-07 DIAGNOSIS — N816 Rectocele: Secondary | ICD-10-CM | POA: Diagnosis not present

## 2016-09-07 DIAGNOSIS — N8111 Cystocele, midline: Secondary | ICD-10-CM | POA: Diagnosis not present

## 2016-10-15 ENCOUNTER — Other Ambulatory Visit: Payer: Self-pay

## 2016-10-15 NOTE — Telephone Encounter (Signed)
We received a fax to refill Astepro 0.15%. Pt has not been seen in over a year. Also, this med was not in BOB's last OV note. Faxed back denial.

## 2016-10-20 DIAGNOSIS — M4697 Unspecified inflammatory spondylopathy, lumbosacral region: Secondary | ICD-10-CM | POA: Diagnosis not present

## 2016-10-20 DIAGNOSIS — M7541 Impingement syndrome of right shoulder: Secondary | ICD-10-CM | POA: Diagnosis not present

## 2016-10-20 DIAGNOSIS — M5136 Other intervertebral disc degeneration, lumbar region: Secondary | ICD-10-CM | POA: Diagnosis not present

## 2016-10-20 DIAGNOSIS — M47816 Spondylosis without myelopathy or radiculopathy, lumbar region: Secondary | ICD-10-CM | POA: Diagnosis not present

## 2017-02-03 DIAGNOSIS — Z6832 Body mass index (BMI) 32.0-32.9, adult: Secondary | ICD-10-CM | POA: Diagnosis not present

## 2017-02-03 DIAGNOSIS — H6123 Impacted cerumen, bilateral: Secondary | ICD-10-CM | POA: Diagnosis not present

## 2017-02-17 DIAGNOSIS — E784 Other hyperlipidemia: Secondary | ICD-10-CM | POA: Diagnosis not present

## 2017-02-17 DIAGNOSIS — M859 Disorder of bone density and structure, unspecified: Secondary | ICD-10-CM | POA: Diagnosis not present

## 2017-02-17 DIAGNOSIS — I10 Essential (primary) hypertension: Secondary | ICD-10-CM | POA: Diagnosis not present

## 2017-02-17 DIAGNOSIS — M109 Gout, unspecified: Secondary | ICD-10-CM | POA: Diagnosis not present

## 2017-02-17 DIAGNOSIS — E038 Other specified hypothyroidism: Secondary | ICD-10-CM | POA: Diagnosis not present

## 2017-02-18 DIAGNOSIS — H6123 Impacted cerumen, bilateral: Secondary | ICD-10-CM | POA: Diagnosis not present

## 2017-02-25 DIAGNOSIS — M8580 Other specified disorders of bone density and structure, unspecified site: Secondary | ICD-10-CM | POA: Diagnosis not present

## 2017-02-25 DIAGNOSIS — I10 Essential (primary) hypertension: Secondary | ICD-10-CM | POA: Diagnosis not present

## 2017-02-25 DIAGNOSIS — N3010 Interstitial cystitis (chronic) without hematuria: Secondary | ICD-10-CM | POA: Diagnosis not present

## 2017-02-25 DIAGNOSIS — Z1389 Encounter for screening for other disorder: Secondary | ICD-10-CM | POA: Diagnosis not present

## 2017-02-25 DIAGNOSIS — N183 Chronic kidney disease, stage 3 (moderate): Secondary | ICD-10-CM | POA: Diagnosis not present

## 2017-02-25 DIAGNOSIS — M109 Gout, unspecified: Secondary | ICD-10-CM | POA: Diagnosis not present

## 2017-02-25 DIAGNOSIS — J45909 Unspecified asthma, uncomplicated: Secondary | ICD-10-CM | POA: Diagnosis not present

## 2017-02-25 DIAGNOSIS — Z6832 Body mass index (BMI) 32.0-32.9, adult: Secondary | ICD-10-CM | POA: Diagnosis not present

## 2017-02-25 DIAGNOSIS — E784 Other hyperlipidemia: Secondary | ICD-10-CM | POA: Diagnosis not present

## 2017-02-25 DIAGNOSIS — Z0000 Encounter for general adult medical examination without abnormal findings: Secondary | ICD-10-CM | POA: Diagnosis not present

## 2017-02-25 DIAGNOSIS — J309 Allergic rhinitis, unspecified: Secondary | ICD-10-CM | POA: Diagnosis not present

## 2017-02-25 DIAGNOSIS — E038 Other specified hypothyroidism: Secondary | ICD-10-CM | POA: Diagnosis not present

## 2017-03-01 DIAGNOSIS — Z1212 Encounter for screening for malignant neoplasm of rectum: Secondary | ICD-10-CM | POA: Diagnosis not present

## 2017-03-23 DIAGNOSIS — M7541 Impingement syndrome of right shoulder: Secondary | ICD-10-CM | POA: Diagnosis not present

## 2017-03-23 DIAGNOSIS — M5136 Other intervertebral disc degeneration, lumbar region: Secondary | ICD-10-CM | POA: Diagnosis not present

## 2017-03-23 DIAGNOSIS — M47816 Spondylosis without myelopathy or radiculopathy, lumbar region: Secondary | ICD-10-CM | POA: Diagnosis not present

## 2017-03-28 DIAGNOSIS — Z1212 Encounter for screening for malignant neoplasm of rectum: Secondary | ICD-10-CM | POA: Diagnosis not present

## 2017-03-28 DIAGNOSIS — Z1211 Encounter for screening for malignant neoplasm of colon: Secondary | ICD-10-CM | POA: Diagnosis not present

## 2017-05-17 DIAGNOSIS — H6123 Impacted cerumen, bilateral: Secondary | ICD-10-CM | POA: Diagnosis not present

## 2017-05-20 DIAGNOSIS — Z23 Encounter for immunization: Secondary | ICD-10-CM | POA: Diagnosis not present

## 2017-05-20 DIAGNOSIS — Z6832 Body mass index (BMI) 32.0-32.9, adult: Secondary | ICD-10-CM | POA: Diagnosis not present

## 2017-05-20 DIAGNOSIS — M542 Cervicalgia: Secondary | ICD-10-CM | POA: Diagnosis not present

## 2017-05-20 DIAGNOSIS — M62830 Muscle spasm of back: Secondary | ICD-10-CM | POA: Diagnosis not present

## 2017-05-31 DIAGNOSIS — H25812 Combined forms of age-related cataract, left eye: Secondary | ICD-10-CM | POA: Diagnosis not present

## 2017-05-31 DIAGNOSIS — Z961 Presence of intraocular lens: Secondary | ICD-10-CM | POA: Diagnosis not present

## 2017-05-31 DIAGNOSIS — H26491 Other secondary cataract, right eye: Secondary | ICD-10-CM | POA: Diagnosis not present

## 2017-06-21 DIAGNOSIS — N8111 Cystocele, midline: Secondary | ICD-10-CM | POA: Diagnosis not present

## 2017-06-21 DIAGNOSIS — R350 Frequency of micturition: Secondary | ICD-10-CM | POA: Diagnosis not present

## 2017-06-21 DIAGNOSIS — N952 Postmenopausal atrophic vaginitis: Secondary | ICD-10-CM | POA: Diagnosis not present

## 2017-06-21 DIAGNOSIS — N393 Stress incontinence (female) (male): Secondary | ICD-10-CM | POA: Diagnosis not present

## 2017-07-20 DIAGNOSIS — M2550 Pain in unspecified joint: Secondary | ICD-10-CM | POA: Diagnosis not present

## 2017-07-20 DIAGNOSIS — J45998 Other asthma: Secondary | ICD-10-CM | POA: Diagnosis not present

## 2017-07-20 DIAGNOSIS — Z6832 Body mass index (BMI) 32.0-32.9, adult: Secondary | ICD-10-CM | POA: Diagnosis not present

## 2017-07-20 DIAGNOSIS — J209 Acute bronchitis, unspecified: Secondary | ICD-10-CM | POA: Diagnosis not present

## 2017-08-26 DIAGNOSIS — K589 Irritable bowel syndrome without diarrhea: Secondary | ICD-10-CM | POA: Diagnosis not present

## 2017-08-26 DIAGNOSIS — E038 Other specified hypothyroidism: Secondary | ICD-10-CM | POA: Diagnosis not present

## 2017-08-26 DIAGNOSIS — Z1389 Encounter for screening for other disorder: Secondary | ICD-10-CM | POA: Diagnosis not present

## 2017-08-26 DIAGNOSIS — J3089 Other allergic rhinitis: Secondary | ICD-10-CM | POA: Diagnosis not present

## 2017-08-26 DIAGNOSIS — M25511 Pain in right shoulder: Secondary | ICD-10-CM | POA: Diagnosis not present

## 2017-08-26 DIAGNOSIS — I10 Essential (primary) hypertension: Secondary | ICD-10-CM | POA: Diagnosis not present

## 2017-08-26 DIAGNOSIS — Z6831 Body mass index (BMI) 31.0-31.9, adult: Secondary | ICD-10-CM | POA: Diagnosis not present

## 2017-08-26 DIAGNOSIS — N3010 Interstitial cystitis (chronic) without hematuria: Secondary | ICD-10-CM | POA: Diagnosis not present

## 2017-08-26 DIAGNOSIS — N183 Chronic kidney disease, stage 3 (moderate): Secondary | ICD-10-CM | POA: Diagnosis not present

## 2017-08-26 DIAGNOSIS — J45998 Other asthma: Secondary | ICD-10-CM | POA: Diagnosis not present

## 2017-09-06 DIAGNOSIS — K589 Irritable bowel syndrome without diarrhea: Secondary | ICD-10-CM | POA: Diagnosis not present

## 2017-09-06 DIAGNOSIS — I10 Essential (primary) hypertension: Secondary | ICD-10-CM | POA: Diagnosis not present

## 2017-09-06 DIAGNOSIS — E038 Other specified hypothyroidism: Secondary | ICD-10-CM | POA: Diagnosis not present

## 2017-09-06 DIAGNOSIS — F418 Other specified anxiety disorders: Secondary | ICD-10-CM | POA: Diagnosis not present

## 2017-09-06 DIAGNOSIS — Z6831 Body mass index (BMI) 31.0-31.9, adult: Secondary | ICD-10-CM | POA: Diagnosis not present

## 2017-09-06 DIAGNOSIS — K5909 Other constipation: Secondary | ICD-10-CM | POA: Diagnosis not present

## 2017-09-06 DIAGNOSIS — R140 Abdominal distension (gaseous): Secondary | ICD-10-CM | POA: Diagnosis not present

## 2017-09-06 DIAGNOSIS — K219 Gastro-esophageal reflux disease without esophagitis: Secondary | ICD-10-CM | POA: Diagnosis not present

## 2017-09-09 DIAGNOSIS — R112 Nausea with vomiting, unspecified: Secondary | ICD-10-CM | POA: Diagnosis not present

## 2017-09-15 DIAGNOSIS — M25511 Pain in right shoulder: Secondary | ICD-10-CM | POA: Diagnosis not present

## 2017-09-24 DIAGNOSIS — M25511 Pain in right shoulder: Secondary | ICD-10-CM | POA: Diagnosis not present

## 2017-10-04 DIAGNOSIS — M75121 Complete rotator cuff tear or rupture of right shoulder, not specified as traumatic: Secondary | ICD-10-CM | POA: Diagnosis not present

## 2017-10-11 ENCOUNTER — Telehealth: Payer: Self-pay

## 2017-10-11 DIAGNOSIS — Z1389 Encounter for screening for other disorder: Secondary | ICD-10-CM | POA: Diagnosis not present

## 2017-10-11 DIAGNOSIS — F418 Other specified anxiety disorders: Secondary | ICD-10-CM | POA: Diagnosis not present

## 2017-10-11 DIAGNOSIS — I10 Essential (primary) hypertension: Secondary | ICD-10-CM | POA: Diagnosis not present

## 2017-10-11 DIAGNOSIS — J3089 Other allergic rhinitis: Secondary | ICD-10-CM | POA: Diagnosis not present

## 2017-10-11 DIAGNOSIS — Z01818 Encounter for other preprocedural examination: Secondary | ICD-10-CM | POA: Diagnosis not present

## 2017-10-11 DIAGNOSIS — J45909 Unspecified asthma, uncomplicated: Secondary | ICD-10-CM | POA: Diagnosis not present

## 2017-10-11 DIAGNOSIS — Z6830 Body mass index (BMI) 30.0-30.9, adult: Secondary | ICD-10-CM | POA: Diagnosis not present

## 2017-10-11 DIAGNOSIS — N183 Chronic kidney disease, stage 3 (moderate): Secondary | ICD-10-CM | POA: Diagnosis not present

## 2017-10-11 NOTE — Telephone Encounter (Signed)
Sent referral to scheduling, attached notes to march file.  

## 2017-11-15 DIAGNOSIS — K219 Gastro-esophageal reflux disease without esophagitis: Secondary | ICD-10-CM | POA: Diagnosis not present

## 2017-11-15 DIAGNOSIS — R490 Dysphonia: Secondary | ICD-10-CM | POA: Diagnosis not present

## 2017-11-15 DIAGNOSIS — H6123 Impacted cerumen, bilateral: Secondary | ICD-10-CM | POA: Diagnosis not present

## 2017-11-15 DIAGNOSIS — R070 Pain in throat: Secondary | ICD-10-CM | POA: Diagnosis not present

## 2017-11-17 DIAGNOSIS — R42 Dizziness and giddiness: Secondary | ICD-10-CM | POA: Diagnosis not present

## 2017-11-17 DIAGNOSIS — Z6830 Body mass index (BMI) 30.0-30.9, adult: Secondary | ICD-10-CM | POA: Diagnosis not present

## 2017-11-17 DIAGNOSIS — R011 Cardiac murmur, unspecified: Secondary | ICD-10-CM | POA: Diagnosis not present

## 2017-11-22 ENCOUNTER — Ambulatory Visit (INDEPENDENT_AMBULATORY_CARE_PROVIDER_SITE_OTHER): Payer: Medicare Other | Admitting: Allergy and Immunology

## 2017-11-22 ENCOUNTER — Encounter: Payer: Self-pay | Admitting: Allergy and Immunology

## 2017-11-22 VITALS — BP 128/88 | HR 76 | Temp 98.0°F | Resp 16 | Ht 61.0 in | Wt 162.0 lb

## 2017-11-22 DIAGNOSIS — J4540 Moderate persistent asthma, uncomplicated: Secondary | ICD-10-CM

## 2017-11-22 DIAGNOSIS — J310 Chronic rhinitis: Secondary | ICD-10-CM

## 2017-11-22 DIAGNOSIS — K219 Gastro-esophageal reflux disease without esophagitis: Secondary | ICD-10-CM

## 2017-11-22 DIAGNOSIS — R42 Dizziness and giddiness: Secondary | ICD-10-CM | POA: Diagnosis not present

## 2017-11-22 MED ORDER — TIOTROPIUM BROMIDE MONOHYDRATE 1.25 MCG/ACT IN AERS: 2.0000 | INHALATION_SPRAY | Freq: Every day | RESPIRATORY_TRACT | 5 refills | Status: DC

## 2017-11-22 NOTE — Assessment & Plan Note (Signed)
   Continue appropriate lifestyle modifications and omeprazole as prescribed. 

## 2017-11-22 NOTE — Progress Notes (Signed)
Follow-up Note  RE: Carla [name] MRN: [id number] DOB: Dec 02, 1935 Date of Office Visit: 11/22/2017  Primary care provider: Prince Solian, MD Referring provider: Prince Solian, MD  History of present illness: Carla [name] is a 82 y.o. female with persistent asthma, nonallergic rhinitis, and gastroesophageal reflux presenting today for sick visit.  She reports that she has been experiencing dyspnea, chest tightness, and wheezing with mild/moderate exertion, such as walking up an incline.  During these times, she takes albuterol with symptom relief.  She is currently taking Symbicort 160-4.5 g, 2 inhalations twice daily.  She admits that she has not been using a spacer device with HFA inhalers.  She has tried montelukast in the past without benefit.  She will be undergoing shoulder surgery requiring general anesthesia in the near future.  She has been experiencing hoarseness recently and as such went to go see her otolaryngologist, Dr. Benjamine Mola, last week.  Laryngoscopy findings suggested laryngeal pharyngeal reflux the culprit for the hoarseness.  A few days ago, she began vertigo when turning her head in a certain position or when sitting up in bed in the morning.  The vertigo is described as a spinning sensation.  Assessment and plan: Moderate persistent asthma Currently with suboptimal control, we will step up therapy at this time.  A prescription has been provided for Spiriva Respimat 1.25 g, 2 inhalations daily.  For now, continue Symbicort 160-4.5 g, 2 inhalations twice daily, and albuterol 1 to 2 inhalations every 6 hours if needed and 10 to 15 minutes prior to exercise.  To maximize pulmonary deposition, a spacer has been provided along with instructions for its proper administration with an HFA inhaler.  The patient has been asked to contact me if her symptoms persist or progress. Otherwise, she may return for follow up in 4 months.  Chronic rhinitis  Continue  fluticasone nasal spray, 2 sprays per nostril daily as needed.  Nasal saline spray (i.e., Simply Saline) or nasal saline lavage (i.e., NeilMed) is recommended as needed and prior to medicated nasal sprays.  Vertigo The patient's history suggests benign paroxysmal positional vertigo.  Follow-up with your otolaryngologist, Dr. Benjamine Mola, for further evaluation and treatment.  GERD (gastroesophageal reflux disease)  Continue appropriate lifestyle modifications and omeprazole as prescribed.   Meds ordered this encounter  Medications  . Tiotropium Bromide Monohydrate (SPIRIVA RESPIMAT) 1.25 MCG/ACT AERS    Sig: Inhale 2 puffs into the lungs daily.    Dispense:  4 g    Refill:  5    Place on hold until patient needs.    Diagnostics: Spirometry reveals an FVC of 1.78 L and an FEV1 of 1.33 L (78% predicted) without significant postbronchodilator improvement.  Please see scanned spirometry results for details.    Physical examination: Blood pressure 128/88, pulse 76, temperature 98 F (36.7 C), temperature source Oral, resp. rate 16, height 5\' 1"  (1.549 m), weight 162 lb (73.5 kg), SpO2 94 %.  General: Alert, interactive, in no acute distress. HEENT: TMs pearly gray, turbinates moderately edematous without discharge, post-pharynx mildly erythematous. Neck: Supple without lymphadenopathy. Lungs: Mildly decreased breath sounds bilaterally without wheezing, rhonchi or rales. CV: Normal S1, S2 without murmurs. Skin: Warm and dry, without lesions or rashes.  The following portions of the patient's history were reviewed and updated as appropriate: allergies, current medications, past family history, past medical history, past social history, past surgical history and problem list.  Allergies as of 11/22/2017      Reactions   Ceclor [  cefaclor]    unknown   Darvon [propoxyphene]    unknown   Doxycycline    unknown      Medication List        Accurate as of 11/22/17  1:00 PM. Always use  your most recent med list.          BENICAR 20 MG tablet Generic drug:  olmesartan Take 20 mg by mouth daily.   budesonide-formoterol 160-4.5 MCG/ACT inhaler Commonly known as:  SYMBICORT Inhale 2 puffs into the lungs 2 (two) times daily.   famotidine 10 MG tablet Commonly known as:  PEPCID Take 10 mg by mouth 2 (two) times daily.   FIBER PO Take 1 tablet by mouth at bedtime.   flurazepam 15 MG capsule Commonly known as:  DALMANE Take 15 mg by mouth 3 (three) times daily.   fluticasone 27.5 MCG/SPRAY nasal spray Commonly known as:  VERAMYST Place 2 sprays into the nose daily.   hydrOXYzine 25 MG tablet Commonly known as:  ATARAX/VISTARIL Take 25 mg by mouth at bedtime.   levalbuterol 45 MCG/ACT inhaler Commonly known as:  XOPENEX HFA Inhale 2 puffs into the lungs every 4 (four) hours as needed for wheezing.   levothyroxine 100 MCG tablet Commonly known as:  SYNTHROID, LEVOTHROID Take 100 mcg by mouth daily before breakfast.   LINZESS 145 MCG Caps capsule Generic drug:  linaclotide Take 145 mcg by mouth at bedtime.   montelukast 10 MG tablet Commonly known as:  SINGULAIR Take 10 mg by mouth at bedtime. Reported on 09/22/2015   multivitamin with minerals Tabs tablet Take 1 tablet by mouth daily.   pentosan polysulfate 100 MG capsule Commonly known as:  ELMIRON Take 100 mg by mouth 3 (three) times daily.   simvastatin 40 MG tablet Commonly known as:  ZOCOR Take 40 mg by mouth at bedtime.   Tiotropium Bromide Monohydrate 1.25 MCG/ACT Aers Commonly known as:  SPIRIVA RESPIMAT Inhale 2 puffs into the lungs daily.       Allergies  Allergen Reactions  . Ceclor [Cefaclor]     unknown  . Darvon [Propoxyphene]     unknown  . Doxycycline     unknown   Review of systems: Review of systems negative except as noted in HPI / PMHx or noted below: Constitutional: Negative.  HENT: Negative.   Eyes: Negative.  Respiratory: Negative.   Cardiovascular:  Negative.  Gastrointestinal: Negative.  Genitourinary: Negative.  Musculoskeletal: Negative.  Neurological: Negative.  Endo/Heme/Allergies: Negative.  Cutaneous: Negative.  Past Medical History:  Diagnosis Date  . Arthritis   . Asthma   . High cholesterol   . Hypertension   . IBS (irritable bowel syndrome)   . Interstitial cystitis     Family History  Problem Relation Age of Onset  . Allergic rhinitis Neg Hx   . Angioedema Neg Hx   . Asthma Neg Hx   . Eczema Neg Hx   . Immunodeficiency Neg Hx   . Urticaria Neg Hx     Social History   Socioeconomic History  . Marital status: Married    Spouse name: Not on file  . Number of children: Not on file  . Years of education: Not on file  . Highest education level: Not on file  Occupational History  . Not on file  Social Needs  . Financial resource strain: Not on file  . Food insecurity:    Worry: Not on file    Inability: Not on file  . Transportation needs:  Medical: Not on file    Non-medical: Not on file  Tobacco Use  . Smoking status: Never Smoker  . Smokeless tobacco: Never Used  Substance and Sexual Activity  . Alcohol use: Yes  . Drug use: Never  . Sexual activity: Not on file  Lifestyle  . Physical activity:    Days per week: Not on file    Minutes per session: Not on file  . Stress: Not on file  Relationships  . Social connections:    Talks on phone: Not on file    Gets together: Not on file    Attends religious service: Not on file    Active member of club or organization: Not on file    Attends meetings of clubs or organizations: Not on file    Relationship status: Not on file  . Intimate partner violence:    Fear of current or ex partner: Not on file    Emotionally abused: Not on file    Physically abused: Not on file    Forced sexual activity: Not on file  Other Topics Concern  . Not on file  Social History Narrative  . Not on file    I appreciate the opportunity to take part in  Cristyn's care. Please do not hesitate to contact me with questions.  Sincerely,   R. Edgar Frisk, MD

## 2017-11-22 NOTE — Patient Instructions (Addendum)
Moderate persistent asthma Currently with suboptimal control, we will step up therapy at this time.  A prescription has been provided for Spiriva Respimat 1.25 g, 2 inhalations daily.  For now, continue Symbicort 160-4.5 g, 2 inhalations twice daily, and albuterol 1 to 2 inhalations every 6 hours if needed and 10 to 15 minutes prior to exercise.  To maximize pulmonary deposition, a spacer has been provided along with instructions for its proper administration with an HFA inhaler.  The patient has been asked to contact me if her symptoms persist or progress. Otherwise, she may return for follow up in 4 months.  Chronic rhinitis  Continue fluticasone nasal spray, 2 sprays per nostril daily as needed.  Nasal saline spray (i.e., Simply Saline) or nasal saline lavage (i.e., NeilMed) is recommended as needed and prior to medicated nasal sprays.  Vertigo The patient's history suggests benign paroxysmal positional vertigo.  Follow-up with your otolaryngologist, Dr. Benjamine Mola, for further evaluation and treatment.  GERD (gastroesophageal reflux disease)  Continue appropriate lifestyle modifications and omeprazole as prescribed.   Return in about 4 months (around 03/24/2018), or if symptoms worsen or fail to improve.

## 2017-11-22 NOTE — Assessment & Plan Note (Signed)
   Continue fluticasone nasal spray, 2 sprays per nostril daily as needed.  Nasal saline spray (i.e., Simply Saline) or nasal saline lavage (i.e., NeilMed) is recommended as needed and prior to medicated nasal sprays.

## 2017-11-22 NOTE — Assessment & Plan Note (Signed)
The patient's history suggests benign paroxysmal positional vertigo.  Follow-up with your otolaryngologist, Dr. Benjamine Mola, for further evaluation and treatment.

## 2017-11-22 NOTE — Assessment & Plan Note (Signed)
Currently with suboptimal control, we will step up therapy at this time.  A prescription has been provided for Spiriva Respimat 1.25 g, 2 inhalations daily.  For now, continue Symbicort 160-4.5 g, 2 inhalations twice daily, and albuterol 1 to 2 inhalations every 6 hours if needed and 10 to 15 minutes prior to exercise.  To maximize pulmonary deposition, a spacer has been provided along with instructions for its proper administration with an HFA inhaler.  The patient has been asked to contact me if her symptoms persist or progress. Otherwise, she may return for follow up in 4 months.

## 2017-11-24 ENCOUNTER — Other Ambulatory Visit: Payer: Self-pay

## 2017-11-24 ENCOUNTER — Ambulatory Visit: Payer: Medicare Other | Attending: Internal Medicine | Admitting: Physical Therapy

## 2017-11-24 ENCOUNTER — Encounter: Payer: Self-pay | Admitting: Physical Therapy

## 2017-11-24 DIAGNOSIS — R42 Dizziness and giddiness: Secondary | ICD-10-CM | POA: Diagnosis not present

## 2017-11-24 DIAGNOSIS — R2681 Unsteadiness on feet: Secondary | ICD-10-CM | POA: Insufficient documentation

## 2017-11-24 DIAGNOSIS — H8111 Benign paroxysmal vertigo, right ear: Secondary | ICD-10-CM | POA: Diagnosis not present

## 2017-11-24 NOTE — Therapy (Signed)
[facility] [address] [place], [place], [address] Phone: [phone]   Fax:  [phone]  Physical Therapy Evaluation  Patient Details  Name: Carla [name] MRN: [id number] Date of Birth: 1936/01/12 Referring Provider: Prince Solian, MD   Encounter Date: 11/24/2017  PT End of Session - 11/24/17 1557    Visit Number  1    Number of Visits  6    Date for PT Re-Evaluation  12/24/17    Authorization Type  Medicare and AARP    PT Start Time  0935    PT Stop Time  1017    PT Time Calculation (min)  42 min    Activity Tolerance  Patient tolerated treatment well    Behavior During Therapy  [facility] for tasks assessed/performed       Past Medical History:  Diagnosis Date  . Arthritis   . Asthma   . High cholesterol   . Hypertension   . IBS (irritable bowel syndrome)   . Interstitial cystitis     Past Surgical History:  Procedure Laterality Date  . CHOLECYSTECTOMY      There were no vitals filed for this visit.   Subjective Assessment - 11/24/17 0943    Subjective  Pt reports having ear wax cleaned out of her ears and dizziness started the next morning.  Happens when she turns to the right, especiall when she turns over to turn off her light.  Has never had vertigo before.    Pertinent History  scoliosis, persistent asthma, nonallergic rhinitis and GERD, IBS, HTN, OA, interstitial cystitis and R rotator cuff tear    Limitations  Other (comment) bed mobility    Patient Stated Goals  To take care of the dizziness    Currently in Pain?  No/denies         [facility] PT Assessment - 11/24/17 0946      Assessment   Medical Diagnosis  Vertigo    Referring Provider  Prince Solian, MD    Onset Date/Surgical Date  11/23/17    Prior Therapy  none      Precautions   Precautions  Other (comment)    Precaution Comments  scoliosis, persistent asthma, nonallergic rhinitis and GERD, IBS, HTN, OA, interstitial cystitis  and R rotator cuff tear      Balance Screen   Has the patient fallen in the past 6 months  No    Has the patient had a decrease in activity level because of a fear of falling?   No    Is the patient reluctant to leave their home because of a fear of falling?   No      Prior Function   Level of Independence  Independent      Observation/Other Assessments   Focus on Therapeutic Outcomes (FOTO)   Not captured           Vestibular Assessment - 11/24/17 0947      Vestibular Assessment   General Observation  Spinning began day after having ears cleaned out; has some imbalance when up walking around.  No changes in hearing; has had headaches but feels it is more a sinus headache.  Denies changes in vision.  Does report some nausea but no vomiting.  Has scoliosis but no current neck issues.      Symptom Behavior   Type of Dizziness  Spinning    Frequency of Dizziness  intermittently    Duration of Dizziness  15 seconds  Aggravating Factors  Supine to sit;Rolling to right;Sit to stand    Relieving Factors  Head stationary      Occulomotor Exam   Occulomotor Alignment  Normal    Spontaneous  Absent    Gaze-induced  Absent    Smooth Pursuits  Intact    Saccades  Intact;Slow    Comment  Convergence intact      Vestibulo-Occular Reflex   VOR to Slow Head Movement  Normal    VOR Cancellation  Normal    Comment  HIT: initially + to R, - to L      Positional Testing   Dix-Hallpike  Dix-Hallpike Right;Dix-Hallpike Left    Horizontal Canal Testing  --      Dix-Hallpike Right   Dix-Hallpike Right Duration  15    Dix-Hallpike Right Symptoms  Upbeat, right rotatory nystagmus      Dix-Hallpike Left   Dix-Hallpike Left Duration  0    Dix-Hallpike Left Symptoms  No nystagmus          Objective measurements completed on examination: See above findings.       Vestibular Treatment/Exercise - 11/24/17 1008      Vestibular Treatment/Exercise   Vestibular Treatment Provided   Canalith Repositioning    Canalith Repositioning  Epley Manuever Right       EPLEY MANUEVER RIGHT   Number of Reps   2    Overall Response  Improved Symptoms    Response Details   required assistance to ambulate to waiting area due to feeling off balance after treatment            PT Education - 11/24/17 1556    Education provided  Yes    Education Details  clinical findings, BPPV, PT POC and goals    Person(s) Educated  Patient    Methods  Explanation    Comprehension  Verbalized understanding          PT Long Term Goals - 11/24/17 1604      PT LONG TERM GOAL #1   Title  Pt will report 0/10 dizziness with bed mobility, sit <> stand and rolling to R side    Time  4    Period  Weeks    Status  New    Target Date  12/24/17      PT LONG TERM GOAL #2   Title  Pt will demonstrate negative positional testing in Hallpike-Dix    Baseline  R posterior canal BPPV    Time  4    Period  Weeks    Status  New    Target Date  12/24/17      PT LONG TERM GOAL #3   Title  Pt will be independent with home self treatment (Epley or Nestor Lewandowsky)    Time  4    Period  Weeks    Status  New    Target Date  12/24/17             Plan - 11/24/17 1559    Clinical Impression Statement  Pt is a 82 year old female referred to Neuro OPPT for evaluation of vertigo.  Pt's PMH is significant for the following: persistent asthma, nonallergic rhinitis and GERD, IBS, HTN, OA, interstitial cystitis and R rotator cuff tear.  The following deficits were noted during pt's exam: positional vertigo and R, upbeating nystagmus of short duration indicating R posterior canal canalithiasis Treated x 2 with CRM but due to scoliosis and poor neck positioning  unable to fully clear BPPV today.  Pt would benefit from skilled PT to address these impairments and functional limitations to maximize functional mobility independence and reduce falls risk.    History and Personal Factors relevant to plan of care:   independent in the community but unable to drive currently due to dizziness, scoliosis, persistent asthma, nonallergic rhinitis and GERD, IBS, HTN, OA, interstitial cystitis and R rotator cuff tear    Clinical Presentation  Stable    Clinical Presentation due to:  independent in the community but unable to drive currently due to dizziness, scoliosis, persistent asthma, nonallergic rhinitis and GERD, IBS, HTN, OA, interstitial cystitis and R rotator cuff tear    Clinical Decision Making  Low    Rehab Potential  Good    PT Frequency  Other (comment) 1-2 times a week     PT Duration  4 weeks    PT Treatment/Interventions  ADLs/Self Care Home Management;Canalith Repostioning;Therapeutic activities;Therapeutic exercise;Balance training;Neuromuscular re-education;Patient/family education;Vestibular    PT Next Visit Plan  re-assess and treat BPPV; teach home maneuver    Consulted and Agree with Plan of Care  Patient       Patient will benefit from skilled therapeutic intervention in order to improve the following deficits and impairments:  Decreased balance, Difficulty walking, Dizziness  Visit Diagnosis: BPPV (benign paroxysmal positional vertigo), right  Dizziness and giddiness  Unsteadiness on feet     Problem List Patient Active Problem List   Diagnosis Date Noted  . Asthma with acute exacerbation 09/22/2015  . GERD (gastroesophageal reflux disease) 09/22/2015  . Moderate persistent asthma 06/10/2015  . Chronic rhinitis 06/10/2015  . Near syncope 10/12/2013  . Vertigo 10/12/2013  . Meningioma ([place]) 10/12/2013  . Irritable bowel syndrome 10/12/2013  . Frequent PVCs 10/12/2013  . Hypotension, unspecified 10/12/2013  . Acute kidney injury ([place]) 10/12/2013   Rico Junker, PT, DPT 11/24/17    4:11 PM   [facility] [address] [place], [place], [address] Phone: [phone]   Fax:  [phone]  Name: FOLASADE MOOTY MRN: [id number] Date of Birth: 03-17-1936

## 2017-11-25 ENCOUNTER — Ambulatory Visit: Payer: Medicare Other | Admitting: Physical Therapy

## 2017-11-25 ENCOUNTER — Encounter: Payer: Self-pay | Admitting: Physical Therapy

## 2017-11-25 DIAGNOSIS — H8111 Benign paroxysmal vertigo, right ear: Secondary | ICD-10-CM | POA: Diagnosis not present

## 2017-11-25 DIAGNOSIS — R2681 Unsteadiness on feet: Secondary | ICD-10-CM

## 2017-11-25 DIAGNOSIS — R42 Dizziness and giddiness: Secondary | ICD-10-CM | POA: Diagnosis not present

## 2017-11-25 NOTE — Therapy (Signed)
[facility] [address] [place], [place], [address] Phone: [phone]   Fax:  [phone]  Physical Therapy Treatment  Patient Details  Name: Carla [name] MRN: [id number] Date of Birth: [birth year] Referring Provider: Prince Solian, MD   Encounter Date: 11/25/2017  PT End of Session - 11/25/17 1635    Visit Number  2    Number of Visits  6    Date for PT Re-Evaluation  12/24/17    Authorization Type  Medicare and AARP    PT Start Time  1406    PT Stop Time  1445    PT Time Calculation (min)  39 min    Activity Tolerance  Patient tolerated treatment well    Behavior During Therapy  [facility] for tasks assessed/performed       Past Medical History:  Diagnosis Date  . Arthritis   . Asthma   . High cholesterol   . Hypertension   . IBS (irritable bowel syndrome)   . Interstitial cystitis     Past Surgical History:  Procedure Laterality Date  . CHOLECYSTECTOMY      There were no vitals filed for this visit.  Subjective Assessment - 11/25/17 1409    Subjective  Feeling the same as yesterday; still had dizziness when looking at alarm clock this morning.    Pertinent History  scoliosis, persistent asthma, nonallergic rhinitis and GERD, IBS, HTN, OA, interstitial cystitis and R rotator cuff tear    Limitations  Other (comment) bed mobility    Patient Stated Goals  To take care of the dizziness    Currently in Pain?  No/denies         [facility] PT Assessment - 11/24/17 0946      Assessment   Medical Diagnosis  Vertigo    Referring Provider  Prince Solian, MD    Onset Date/Surgical Date  11/23/17    Prior Therapy  none      Precautions   Precautions  Other (comment)    Precaution Comments  scoliosis, persistent asthma, nonallergic rhinitis and GERD, IBS, HTN, OA, interstitial cystitis and R rotator cuff tear      Balance Screen   Has the patient fallen in the past 6 months  No    Has the patient had a  decrease in activity level because of a fear of falling?   No    Is the patient reluctant to leave their home because of a fear of falling?   No      Prior Function   Level of Independence  Independent      Observation/Other Assessments   Focus on Therapeutic Outcomes (FOTO)   Not captured         Vestibular Assessment - 11/25/17 1410      Positional Testing   Dix-Hallpike  Dix-Hallpike Right;Dix-Hallpike Left    Horizontal Canal Testing  Horizontal Canal Right;Horizontal Canal Left      Dix-Hallpike Right   Dix-Hallpike Right Duration  10 seconds    Dix-Hallpike Right Symptoms  Upbeat, right rotatory nystagmus      Dix-Hallpike Left   Dix-Hallpike Left Duration  0    Dix-Hallpike Left Symptoms  No nystagmus      Horizontal Canal Right   Horizontal Canal Right Duration  0    Horizontal Canal Right Symptoms  Normal      Horizontal Canal Left   Horizontal Canal Left Duration  0    Horizontal Canal Left Symptoms  Normal  Vestibular Treatment/Exercise - 11/25/17 1425      Vestibular Treatment/Exercise   Vestibular Treatment Provided  Canalith Repositioning    Canalith Repositioning  Epley Manuever Right       EPLEY MANUEVER RIGHT   Number of Reps   2    Overall Response  Improved Symptoms    Response Details   decreased imbalance and wooziness with ambulation today after treatment            PT Education - 11/25/17 1635    Education provided  Yes    Education Details  BPPV     Person(s) Educated  Patient    Methods  Explanation;Handout    Comprehension  Verbalized understanding          PT Long Term Goals - 11/24/17 1604      PT LONG TERM GOAL #1   Title  Pt will report 0/10 dizziness with bed mobility, sit <> stand and rolling to R side    Time  4    Period  Weeks    Status  New    Target Date  12/24/17      PT LONG TERM GOAL #2   Title  Pt will demonstrate negative positional testing in Hallpike-Dix    Baseline  R  posterior canal BPPV    Time  4    Period  Weeks    Status  New    Target Date  12/24/17      PT LONG TERM GOAL #3   Title  Pt will be independent with home self treatment (Epley or Nestor Lewandowsky)    Time  4    Period  Weeks    Status  New    Target Date  12/24/17            Plan - 11/25/17 1635    Clinical Impression Statement  Pt returns for further treatment of BPPV.  Peripheral canals re-assessed with bilat horizontal canals negative for nystagmus or vertigo.  Pt continued to present with R, upbeating nystagmus indicating ongoing R posterior canal canalithiasis.  Treated x 2 with CRM with improvement in symptoms and no reports of imbalance when ambulating at the end of the session.  During rest breaks continued to answer patient questions and educate pt on BPPV; handout provided.  Will continue to assess and treat as needed.    Rehab Potential  Good    PT Frequency  Other (comment) 1-2 times a week     PT Duration  4 weeks    PT Treatment/Interventions  ADLs/Self Care Home Management;Canalith Repostioning;Therapeutic activities;Therapeutic exercise;Balance training;Neuromuscular re-education;Patient/family education;Vestibular    PT Next Visit Plan  re-assess and treat R BPPV if indicated; teach home maneuver    Consulted and Agree with Plan of Care  Patient       Patient will benefit from skilled therapeutic intervention in order to improve the following deficits and impairments:  Decreased balance, Difficulty walking, Dizziness  Visit Diagnosis: BPPV (benign paroxysmal positional vertigo), right  Dizziness and giddiness  Unsteadiness on feet     Problem List Patient Active Problem List   Diagnosis Date Noted  . Asthma with acute exacerbation 09/22/2015  . GERD (gastroesophageal reflux disease) 09/22/2015  . Moderate persistent asthma 06/10/2015  . Chronic rhinitis 06/10/2015  . Near syncope 10/12/2013  . Vertigo 10/12/2013  . Meningioma ([place]) 10/12/2013  .  Irritable bowel syndrome 10/12/2013  . Frequent PVCs 10/12/2013  . Hypotension, unspecified 10/12/2013  . Acute kidney injury (  [place]) 10/12/2013    Rico Junker, PT, DPT 11/25/17    4:39 PM    [facility] [address] [place], [place], [address] Phone: [phone]   Fax:  [phone]  Name: Carla [name] MRN: [id number] Date of Birth: 12-13-1935

## 2017-11-30 ENCOUNTER — Ambulatory Visit: Payer: Medicare Other | Admitting: Cardiovascular Disease

## 2017-12-01 ENCOUNTER — Ambulatory Visit: Payer: Medicare Other | Admitting: Rehabilitative and Restorative Service Providers"

## 2017-12-01 ENCOUNTER — Encounter: Payer: Self-pay | Admitting: Rehabilitative and Restorative Service Providers"

## 2017-12-01 DIAGNOSIS — R2681 Unsteadiness on feet: Secondary | ICD-10-CM

## 2017-12-01 DIAGNOSIS — H8111 Benign paroxysmal vertigo, right ear: Secondary | ICD-10-CM

## 2017-12-01 DIAGNOSIS — R42 Dizziness and giddiness: Secondary | ICD-10-CM | POA: Diagnosis not present

## 2017-12-01 NOTE — Therapy (Signed)
[facility] [address] [place], [place], [address] Phone: [phone]   Fax:  [phone]  Physical Therapy Treatment  Patient Details  Name: Carla [name] MRN: [id number] Date of Birth: 04/17/1936 Referring Provider: Prince Solian, MD   Encounter Date: 12/01/2017  PT End of Session - 12/01/17 1118    Visit Number  3    Number of Visits  6    Date for PT Re-Evaluation  12/24/17    Authorization Type  Medicare and AARP    PT Start Time  1020    PT Stop Time  1107    PT Time Calculation (min)  47 min    Activity Tolerance  Patient tolerated treatment well    Behavior During Therapy  [facility] for tasks assessed/performed       Past Medical History:  Diagnosis Date  . Arthritis   . Asthma   . High cholesterol   . Hypertension   . IBS (irritable bowel syndrome)   . Interstitial cystitis     Past Surgical History:  Procedure Laterality Date  . CHOLECYSTECTOMY      There were no vitals filed for this visit.  Subjective Assessment - 12/01/17 1022    Subjective  The patient notes that Sunday she had a bad day from gastric standpoint (h/o IB) and felt lightheaded, however it lasted all day which is not typical.  Monday she felt better, Tuesday she bent forward and got a severe spinning sensation and she had to lay on the couch and keep her head still for the remainder of the day.    Pertinent History  scoliosis, persistent asthma, nonallergic rhinitis and GERD, IBS, HTN, OA, interstitial cystitis and R rotator cuff tear    Patient Stated Goals  To take care of the dizziness    Currently in Pain?  No/denies            Vestibular Assessment - 12/01/17 1034      Positional Testing   Dix-Hallpike  Dix-Hallpike Right;Dix-Hallpike Left    Sidelying Test  Sidelying Right;Sidelying Left    Horizontal Canal Testing  Horizontal Canal Right;Horizontal Canal Left      Dix-Hallpike Right   Dix-Hallpike Right  Duration  0    Dix-Hallpike Right Symptoms  No nystagmus      Dix-Hallpike Left   Dix-Hallpike Left Duration  0    Dix-Hallpike Left Symptoms  No nystagmus      Sidelying Right   Sidelying Right Duration  "does not feel quite steady" with low amplitude subtle nystagmus noted in right sidelying;  remains a low grade sensation in position    Sidelying Right Symptoms  Upbeat, right rotatory nystagmus      Sidelying Left   Sidelying Left Duration  0    Sidelying Left Symptoms  No nystagmus      Horizontal Canal Right   Horizontal Canal Right Duration  persistent sensation of dizziness, low grade, subtle symptoms    Horizontal Canal Right Symptoms  -- trace upbeat, right noted      Horizontal Canal Left   Horizontal Canal Left Duration  persistent sensation of dizziness, low grade, subtle symptoms    Horizontal Canal Left Symptoms  Normal                Vestibular Treatment/Exercise - 12/01/17 1045      Vestibular Treatment/Exercise   Vestibular Treatment Provided  Habituation    Habituation Exercises  Laruth Bouchard Daroff;Horizontal Roll  Gaze Exercises  X1 Viewing Horizontal      Nestor Lewandowsky   Number of Reps   3    Symptom Description   Vertigo reported on first repetition that lasted x seconds.  Also noted sensation with return to sitting.  Symptoms diminished with repetition.  PT provided for HEP with education to do when family nearby or having phone nearby in case symptoms become more severe (due to symptoms patient experienced on Tuesday).      Horizontal Roll   Number of Reps   4    Symptom Description   Symtpoms remained low grade with sensation of minimal environmental movmeent (trace) with each repetition and to both sides.      X1 Viewing Horizontal   Foot Position  seated position    Comments  Needs cues to keep body still and move head without shoulder movement.            PT Education - 12/01/17 1118    Education provided  Yes    Education Details   HEP provided for low level symptoms with rolling and sit<>sidelying.  Also provided gaze adaptation x 1 with cues on neck position and speed.    Person(s) Educated  Patient    Methods  Explanation;Demonstration;Handout    Comprehension  Returned demonstration;Verbalized understanding          PT Long Term Goals - 11/24/17 1604      PT LONG TERM GOAL #1   Title  Pt will report 0/10 dizziness with bed mobility, sit <> stand and rolling to R side    Time  4    Period  Weeks    Status  New    Target Date  12/24/17      PT LONG TERM GOAL #2   Title  Pt will demonstrate negative positional testing in Hallpike-Dix    Baseline  R posterior canal BPPV    Time  4    Period  Weeks    Status  New    Target Date  12/24/17      PT LONG TERM GOAL #3   Title  Pt will be independent with home self treatment (Epley or Nestor Lewandowsky)    Time  4    Period  Weeks    Status  New    Target Date  12/24/17            Plan - 12/01/17 1121    Clinical Impression Statement  The patient has had a severe episode of dizziness since last session (on 11/29/17).  At today's visit, note a very low grade, low amplitude (hard to view in room light) nystagmus worse in R horiozntal roll (note an upbeat, rotary direction), and with R sidelying on first rep.  In horizontal roll, the patient notes persistent symptoms, which could indicate some cupulolithiasis and in R sidelying, note symptoms that subside within seconds, indicating canalithiasis.  PT approached remaining symptoms with habituation in both planes (horizontal roll and brandt daroff).  The patient tolerated activities well.     PT Treatment/Interventions  ADLs/Self Care Home Management;Canalith Repostioning;Therapeutic activities;Therapeutic exercise;Balance training;Neuromuscular re-education;Patient/family education;Vestibular    PT Next Visit Plan  Reassess and treat as indicated.  *Did not teach home maneuver due to significant neck tightness-  review habituation and gaze exercise.    Consulted and Agree with Plan of Care  Patient       Patient will benefit from skilled therapeutic intervention in order to improve the following  deficits and impairments:  Decreased balance, Difficulty walking, Dizziness  Visit Diagnosis: BPPV (benign paroxysmal positional vertigo), right  Dizziness and giddiness  Unsteadiness on feet     Problem List Patient Active Problem List   Diagnosis Date Noted  . Asthma with acute exacerbation 09/22/2015  . GERD (gastroesophageal reflux disease) 09/22/2015  . Moderate persistent asthma 06/10/2015  . Chronic rhinitis 06/10/2015  . Near syncope 10/12/2013  . Vertigo 10/12/2013  . Meningioma ([place]) 10/12/2013  . Irritable bowel syndrome 10/12/2013  . Frequent PVCs 10/12/2013  . Hypotension, unspecified 10/12/2013  . Acute kidney injury ([place]) 10/12/2013    Crescent, PT 12/01/2017, 11:27 AM  [facility] [address] [place], [place], [address] Phone: [phone]   Fax:  [phone]  Name: Carla [name] MRN: [id number] Date of Birth: [birth year]

## 2017-12-01 NOTE — Patient Instructions (Signed)
Habituation - Tip Card  1.The goal of habituation training is to assist in decreasing symptoms of vertigo, dizziness, or nausea provoked by specific head and body motions. 2.These exercises may initially increase symptoms; however, be persistent and work through symptoms. With repetition and time, the exercises will assist in reducing or eliminating symptoms. 3.Exercises should be stopped and discussed with the therapist if you experience any of the following: - Sudden change or fluctuation in hearing - New onset of ringing in the ears, or increase in current intensity - Any fluid discharge from the ear - Severe pain in neck or back - Extreme nausea  Copyright  VHI. All rights reserved.   Habituation - Rolling   With pillow under head, start on back. Roll to your right side.  Hold until dizziness stops, plus 20 seconds and then roll to the left side.  Hold until dizziness stops, plus 20 seconds.  Repeat sequence 5 times per session. Do 2 sessions per day.  Copyright  VHI. All rights reserved.          Habituation - Sit to Side-Lying   Sit on edge of bed. Lie down onto the right side and hold until dizziness stops, plus 20 seconds.  Return to sitting and wait until dizziness stops, plus 20 seconds.  Repeat to the left side. Repeat sequence 5 times per session. Do 2 sessions per day.  Copyright  VHI. All rights reserved.    Gaze Stabilization - Tip Card  1.Target must remain in focus, not blurry, and appear stationary while head is in motion. 2.Perform exercises with small head movements (45 to either side of midline). 3.Increase speed of head motion so long as target is in focus. 4.If you wear eyeglasses, be sure you can see target through lens (therapist will give specific instructions for bifocal / progressive lenses). 5.These exercises may provoke dizziness or nausea. Work through these symptoms. If too dizzy, slow head movement slightly. Rest between each  exercise. 6.Exercises demand concentration; avoid distractions. 7.For safety, perform standing exercises close to a counter, wall, corner, or next to someone.  Copyright  VHI. All rights reserved.   Gaze Stabilization - Standing Feet Apart   Feet shoulder width apart, keeping eyes on target on wall 3 feet away, tilt head down slightly and move head side to side for 30 seconds. Repeat while moving head up and down for 30 seconds. *Work up to tolerating 60 seconds, as able. Do 2-3 sessions per day.   Copyright  VHI. All rights reserved.

## 2017-12-06 ENCOUNTER — Emergency Department (HOSPITAL_COMMUNITY)
Admission: EM | Admit: 2017-12-06 | Discharge: 2017-12-07 | Disposition: A | Discharge status: Home / Self Care | Payer: Medicare Other | Attending: Emergency Medicine | Admitting: Emergency Medicine

## 2017-12-06 DIAGNOSIS — J45909 Unspecified asthma, uncomplicated: Secondary | ICD-10-CM | POA: Diagnosis not present

## 2017-12-06 DIAGNOSIS — Z87891 Personal history of nicotine dependence: Secondary | ICD-10-CM | POA: Diagnosis not present

## 2017-12-06 DIAGNOSIS — E039 Hypothyroidism, unspecified: Secondary | ICD-10-CM | POA: Insufficient documentation

## 2017-12-06 DIAGNOSIS — R52 Pain, unspecified: Secondary | ICD-10-CM | POA: Diagnosis not present

## 2017-12-06 DIAGNOSIS — Z79899 Other long term (current) drug therapy: Secondary | ICD-10-CM | POA: Insufficient documentation

## 2017-12-06 DIAGNOSIS — M5430 Sciatica, unspecified side: Secondary | ICD-10-CM | POA: Diagnosis not present

## 2017-12-06 DIAGNOSIS — R42 Dizziness and giddiness: Secondary | ICD-10-CM | POA: Diagnosis not present

## 2017-12-06 DIAGNOSIS — I10 Essential (primary) hypertension: Secondary | ICD-10-CM | POA: Insufficient documentation

## 2017-12-06 DIAGNOSIS — M545 Low back pain: Secondary | ICD-10-CM | POA: Diagnosis present

## 2017-12-06 DIAGNOSIS — M5416 Radiculopathy, lumbar region: Secondary | ICD-10-CM | POA: Diagnosis not present

## 2017-12-06 DIAGNOSIS — M5489 Other dorsalgia: Secondary | ICD-10-CM | POA: Diagnosis not present

## 2017-12-06 DIAGNOSIS — M5441 Lumbago with sciatica, right side: Secondary | ICD-10-CM | POA: Diagnosis not present

## 2017-12-06 HISTORY — DX: Benign neoplasm of meninges, unspecified: D32.9

## 2017-12-06 HISTORY — DX: Hypothyroidism, unspecified: E03.9

## 2017-12-06 HISTORY — DX: Zoster without complications: B02.9

## 2017-12-06 HISTORY — DX: Ventricular premature depolarization: I49.3

## 2017-12-06 HISTORY — DX: Other chest pain: R07.89

## 2017-12-06 HISTORY — DX: Unspecified osteoarthritis, unspecified site: M19.90

## 2017-12-06 HISTORY — DX: Nontoxic goiter, unspecified: E04.9

## 2017-12-06 HISTORY — DX: Gout, unspecified: M10.9

## 2017-12-06 HISTORY — DX: Hyperlipidemia, unspecified: E78.5

## 2017-12-06 HISTORY — DX: Dorsalgia, unspecified: M54.9

## 2017-12-06 MED ORDER — ONDANSETRON HCL 4 MG/2ML IJ SOLN
4.0000 mg | Freq: Once | INTRAMUSCULAR | Status: AC
  Administered 2017-12-07: 4 mg via INTRAVENOUS
  Filled 2017-12-06: qty 2

## 2017-12-06 MED ORDER — SODIUM CHLORIDE 0.9 % IV BOLUS
1000.0000 mL | Freq: Once | INTRAVENOUS | Status: AC
  Administered 2017-12-07: 1000 mL via INTRAVENOUS

## 2017-12-06 MED ORDER — MORPHINE SULFATE (PF) 4 MG/ML IV SOLN
4.0000 mg | Freq: Once | INTRAVENOUS | Status: AC
  Administered 2017-12-07: 4 mg via INTRAVENOUS
  Filled 2017-12-06: qty 1

## 2017-12-06 NOTE — ED Provider Notes (Signed)
[facility] DEPT Provider Note   CSN: [id number] Arrival date & time: 12/06/17  2217     History   Chief Complaint Chief Complaint  Patient presents with  . Back Pain  . Dizziness    HPI Carla [name] is a 82 y.o. female.  Patient is a 82 year old female with past medical history of irritable bowel and vertigo.  She presents today for evaluation of back pain.  This started this morning in the absence of any injury or trauma.  The pain is located in her right lower lumbar region and radiates into the back of her leg.  This began in the absence of any injury or trauma.  She denies any bowel or bladder complaints.  She denies any weakness, numbness, or tingling.  She also reports nausea and vomiting that she believes is related to the pain and/or vertigo.  The history is provided by the patient.  Back Pain   This is a new problem. Episode onset: This morning. The problem occurs constantly. The problem has been rapidly worsening. The pain is associated with no known injury. The pain is present in the lumbar spine. The quality of the pain is described as stabbing. The pain radiates to the right thigh. The pain is severe. The symptoms are aggravated by bending and twisting. Pertinent negatives include no bowel incontinence, no bladder incontinence, no dysuria, no paresis and no tingling. She has tried nothing for the symptoms.    Past Medical History:  Diagnosis Date  . Arthritis   . Asthma   . Atypical chest pain   . Back pain   . DJD (degenerative joint disease)   . Goiter   . Gout   . Hyperlipidemia   . Hypertension   . Hypothyroidism   . IBS (irritable bowel syndrome)   . Interstitial cystitis   . Meningioma ([place])   . PVC (premature ventricular contraction)   . Shingles     Patient Active Problem List   Diagnosis Date Noted  . Asthma with acute exacerbation 09/22/2015  . GERD (gastroesophageal reflux disease) 09/22/2015  . Moderate  persistent asthma 06/10/2015  . Chronic rhinitis 06/10/2015  . Near syncope 10/12/2013  . Vertigo 10/12/2013  . Meningioma ([place]) 10/12/2013  . Irritable bowel syndrome 10/12/2013  . Frequent PVCs 10/12/2013  . Hypotension, unspecified 10/12/2013  . Acute kidney injury ([place]) 10/12/2013    Past Surgical History:  Procedure Laterality Date  . CHOLECYSTECTOMY  2003  . FINGER SURGERY  1990  . FOOT SURGERY  2004  . WRIST SURGERY  1955     OB History   None      Home Medications    Prior to Admission medications   Medication Sig Start Date End Date Taking? Authorizing Provider  budesonide-formoterol (SYMBICORT) 160-4.5 MCG/ACT inhaler Inhale 2 puffs into the lungs 2 (two) times daily.    [provider]  famotidine (PEPCID) 10 MG tablet Take 10 mg by mouth 2 (two) times daily.    [provider]  FIBER PO Take 1 tablet by mouth at bedtime.    [provider]  flurazepam (DALMANE) 15 MG capsule Take 15 mg by mouth 3 (three) times daily.    [provider]  fluticasone (VERAMYST) 27.5 MCG/SPRAY nasal spray Place 2 sprays into the nose daily.    [provider]  hydrOXYzine (ATARAX/VISTARIL) 25 MG tablet Take 25 mg by mouth at bedtime.    [provider]  levalbuterol Penne Lash HFA) 45  MCG/ACT inhaler Inhale 2 puffs into the lungs every 4 (four) hours as needed for wheezing. Patient not taking: Reported on 11/24/2017 07/26/16   Melony Overly, MD  levothyroxine (SYNTHROID, LEVOTHROID) 100 MCG tablet Take 100 mcg by mouth daily before breakfast.    [provider]  Linaclotide (LINZESS) 145 MCG CAPS capsule Take 145 mcg by mouth at bedtime.    [provider]  montelukast (SINGULAIR) 10 MG tablet Take 10 mg by mouth at bedtime. Reported on 09/22/2015    [provider]  Multiple Vitamin (MULTIVITAMIN WITH MINERALS) TABS tablet Take 1 tablet by mouth daily.    [provider]  olmesartan (BENICAR) 20 MG  tablet Take 20 mg by mouth daily.    [provider]  pentosan polysulfate (ELMIRON) 100 MG capsule Take 100 mg by mouth 3 (three) times daily.    [provider]  simvastatin (ZOCOR) 40 MG tablet Take 40 mg by mouth at bedtime.    [provider]  Tiotropium Bromide Monohydrate (SPIRIVA RESPIMAT) 1.25 MCG/ACT AERS Inhale 2 puffs into the lungs daily. 11/22/17   Bobbitt, Sedalia Muta, MD    Family History Family History  Problem Relation Age of Onset  . Other Mother        Died of Toxemia at the age of 8  . Heart failure Father   . COPD Father   . Arthritis Father   . Hypertension Father   . Allergic rhinitis Neg Hx   . Angioedema Neg Hx   . Asthma Neg Hx   . Eczema Neg Hx   . Immunodeficiency Neg Hx   . Urticaria Neg Hx     Social History Social History   Tobacco Use  . Smoking status: Former Smoker    Types: Cigarettes    Last attempt to quit: 1986    Years since quitting: 33.3  . Smokeless tobacco: Never Used  Substance Use Topics  . Alcohol use: Yes  . Drug use: Never     Allergies   Ceclor [cefaclor]; Darvon [propoxyphene]; and Doxycycline   Review of Systems Review of Systems  Gastrointestinal: Negative for bowel incontinence.  Genitourinary: Negative for bladder incontinence and dysuria.  Musculoskeletal: Positive for back pain.  Neurological: Negative for tingling.  All other systems reviewed and are negative.    Physical Exam Updated Vital Signs BP (!) 165/68 (BP Location: Right Arm)   Pulse 60   Temp 98.6 F (37 C) (Oral)   Resp 20   Ht 5\' 1"  (1.549 m)   Wt 73.5 kg (162 lb)   SpO2 97%   BMI 30.61 kg/m   Physical Exam  Constitutional: She is oriented to person, place, and time. She appears well-developed and well-nourished. No distress.  HENT:  Head: Normocephalic and atraumatic.  Neck: Normal range of motion. Neck supple.  Cardiovascular: Normal rate and regular rhythm. Exam reveals no gallop and no friction  rub.  No murmur heard. Pulmonary/Chest: Effort normal and breath sounds normal. No respiratory distress. She has no wheezes.  Abdominal: Soft. Bowel sounds are normal. She exhibits no distension. There is no tenderness.  Musculoskeletal: Normal range of motion.  There is tenderness to palpation in the right lower lumbar region.  Neurological: She is alert and oriented to person, place, and time.  Strength is 5 out of 5 in both lower extremities.  DTRs are 2+ and symmetrical in the patellar and Achilles tendons bilaterally.  Skin: Skin is warm and dry. She is not diaphoretic.  Nursing note and vitals reviewed.    ED Treatments / Results  Labs (all labs ordered are listed, but only abnormal results are displayed) Labs Reviewed  BASIC METABOLIC PANEL  CBC WITH DIFFERENTIAL/PLATELET    EKG None  Radiology No results found.  Procedures Procedures (including critical care time)  Medications Ordered in ED Medications  sodium chloride 0.9 % bolus 1,000 mL (has no administration in time range)  ondansetron (ZOFRAN) injection 4 mg (has no administration in time range)  morphine 4 MG/ML injection 4 mg (has no administration in time range)     Initial Impression / Assessment and Plan / ED Course  I have reviewed the triage vital signs and the nursing notes.  Pertinent labs & imaging results that were available during my care of the patient were reviewed by me and considered in my medical decision making (see chart for details).  Patient presents with complaints of low back pain that started earlier this morning in the absence of any injury or trauma.  She has no neurologic deficits on exam and CT scan fails to reveal any acute abnormality.  She is feeling better after medications given in the ER and will be discharged with medication for pain and nausea.  She is described dizziness while in the emergency department, but states that this is consistent with her vertigo which she  suffers from regularly.  She has been very groggy since receiving her medications in the ER and I am reluctant to administer more medications which could possibly make her dizziness worse.  Final Clinical Impressions(s) / ED Diagnoses   Final diagnoses:  None    ED Discharge Orders    None       Veryl Speak, MD 12/07/17 [phone]

## 2017-12-06 NOTE — ED Triage Notes (Signed)
Patient is from home and transported via [facility]. Patient is complaining of back pain and vertigo. Patient was seen at the chiropractor office today, usual appointment. Afterwards, she developed sudden back pain. She took a muscle relaxer as directed on a empty stomach. Then, she ate food and developed nausea and vomiting.

## 2017-12-07 ENCOUNTER — Emergency Department (HOSPITAL_COMMUNITY): Payer: Medicare Other

## 2017-12-07 DIAGNOSIS — M5430 Sciatica, unspecified side: Secondary | ICD-10-CM | POA: Diagnosis not present

## 2017-12-07 DIAGNOSIS — M5416 Radiculopathy, lumbar region: Secondary | ICD-10-CM | POA: Diagnosis not present

## 2017-12-07 LAB — CBC WITH DIFFERENTIAL/PLATELET
Basophils Absolute: 0 10*3/uL (ref 0.0–0.1)
Basophils Relative: 0 %
Eosinophils Absolute: 0.1 10*3/uL (ref 0.0–0.7)
Eosinophils Relative: 2 %
HCT: 41.6 % (ref 36.0–46.0)
Hemoglobin: 14.2 g/dL (ref 12.0–15.0)
Lymphocytes Relative: 19 %
Lymphs Abs: 1.3 10*3/uL (ref 0.7–4.0)
MCH: 30.9 pg (ref 26.0–34.0)
MCHC: 34.1 g/dL (ref 30.0–36.0)
MCV: 90.6 fL (ref 78.0–100.0)
Monocytes Absolute: 0.4 10*3/uL (ref 0.1–1.0)
Monocytes Relative: 6 %
Neutro Abs: 4.9 10*3/uL (ref 1.7–7.7)
Neutrophils Relative %: 73 %
Platelets: 199 10*3/uL (ref 150–400)
RBC: 4.59 MIL/uL (ref 3.87–5.11)
RDW: 13.7 % (ref 11.5–15.5)
WBC: 6.6 10*3/uL (ref 4.0–10.5)

## 2017-12-07 LAB — BASIC METABOLIC PANEL
Anion gap: 14 (ref 5–15)
BUN: 15 mg/dL (ref 6–20)
CO2: 19 mmol/L — ABNORMAL LOW (ref 22–32)
Calcium: 9.5 mg/dL (ref 8.9–10.3)
Chloride: 109 mmol/L (ref 101–111)
Creatinine, Ser: 1.05 mg/dL — ABNORMAL HIGH (ref 0.44–1.00)
GFR calc Af Amer: 57 mL/min — ABNORMAL LOW (ref >60)
GFR calc non Af Amer: 49 mL/min — ABNORMAL LOW (ref >60)
Glucose, Bld: 117 mg/dL — ABNORMAL HIGH (ref 65–99)
Potassium: 3.2 mmol/L — ABNORMAL LOW (ref 3.5–5.1)
Sodium: 142 mmol/L (ref 135–145)

## 2017-12-07 MED ORDER — PROMETHAZINE HCL 25 MG/ML IJ SOLN
12.5000 mg | Freq: Once | INTRAMUSCULAR | Status: AC
  Administered 2017-12-07: 12.5 mg via INTRAVENOUS
  Filled 2017-12-07: qty 1

## 2017-12-07 MED ORDER — ONDANSETRON 8 MG PO TBDP: ORAL_TABLET | ORAL | 0 refills | Status: DC

## 2017-12-07 MED ORDER — HYDROCODONE-ACETAMINOPHEN 5-325 MG PO TABS: 1.0000 | ORAL_TABLET | Freq: Four times a day (QID) | ORAL | 0 refills | Status: DC | PRN

## 2017-12-07 MED ORDER — HYDROMORPHONE HCL 1 MG/ML IJ SOLN
1.0000 mg | Freq: Once | INTRAMUSCULAR | Status: AC
  Administered 2017-12-07: 1 mg via INTRAVENOUS
  Filled 2017-12-07: qty 1

## 2017-12-07 NOTE — Discharge Instructions (Addendum)
Hydrocodone is prescribed as needed for pain.  Zofran as prescribed as needed for nausea.  Follow-up with your primary doctor if symptoms are not improving in the next few days.

## 2017-12-08 DIAGNOSIS — M2550 Pain in unspecified joint: Secondary | ICD-10-CM | POA: Diagnosis not present

## 2017-12-08 DIAGNOSIS — M5416 Radiculopathy, lumbar region: Secondary | ICD-10-CM | POA: Diagnosis not present

## 2017-12-08 DIAGNOSIS — R42 Dizziness and giddiness: Secondary | ICD-10-CM | POA: Diagnosis not present

## 2017-12-09 ENCOUNTER — Ambulatory Visit: Payer: Medicare Other | Admitting: Physical Therapy

## 2017-12-09 ENCOUNTER — Encounter: Payer: Self-pay | Admitting: Physical Therapy

## 2017-12-09 VITALS — BP 116/72

## 2017-12-09 DIAGNOSIS — R42 Dizziness and giddiness: Secondary | ICD-10-CM | POA: Diagnosis not present

## 2017-12-09 DIAGNOSIS — R2681 Unsteadiness on feet: Secondary | ICD-10-CM | POA: Diagnosis not present

## 2017-12-09 DIAGNOSIS — H8111 Benign paroxysmal vertigo, right ear: Secondary | ICD-10-CM | POA: Diagnosis not present

## 2017-12-09 NOTE — Therapy (Signed)
[facility] [address] [place], [place], [address] Phone: [phone]   Fax:  [phone]  Physical Therapy Treatment  Patient Details  Name: Carla [name] MRN: [id number] Date of Birth: June 05, 1936 Referring Provider: Prince Solian, MD   Encounter Date: 12/09/2017  PT End of Session - 12/09/17 1512    Visit Number  4    Number of Visits  6    Date for PT Re-Evaluation  12/24/17    Authorization Type  Medicare and AARP    PT Start Time  1152    PT Stop Time  1240    PT Time Calculation (min)  48 min    Activity Tolerance  Treatment limited secondary to medical complications (Comment) some central vestibular signs which are new    Behavior During Therapy  [facility] for tasks assessed/performed       Past Medical History:  Diagnosis Date  . Arthritis   . Asthma   . Atypical chest pain   . Back pain   . DJD (degenerative joint disease)   . Goiter   . Gout   . Hyperlipidemia   . Hypertension   . Hypothyroidism   . IBS (irritable bowel syndrome)   . Interstitial cystitis   . Meningioma ([place])   . PVC (premature ventricular contraction)   . Shingles     Past Surgical History:  Procedure Laterality Date  . CHOLECYSTECTOMY  2003  . FINGER SURGERY  1990  . FOOT SURGERY  2004  . WRIST SURGERY  1955    Vitals:   12/09/17 1234  BP: 116/72    Subjective Assessment - 12/09/17 1155    Subjective  Terrible leg pain on Tuesday and terrrible nausea. Ended up going to ED. Given nausea and pain meds. When went home the TV was (she gestures scrolling vertically) and everything has looked like it was moving since then. Reports now the movements are more horizontal or "wavey" with occasional double vision. She also noted she has numbness along the eintire outside of right thigh. Not sure if she's had this numbness with her back/leg pain with previous episodes.     Pertinent History  scoliosis, persistent asthma,  nonallergic rhinitis and GERD, IBS, HTN, OA, interstitial cystitis and R rotator cuff tear    Patient Stated Goals  To take care of the dizziness    Currently in Pain?  Yes    Pain Score  -- just a little sore now    Pain Location  Back    Pain Orientation  Right;Lower    Pain Descriptors / Indicators  Aching    Pain Onset  In the past 7 days    Pain Frequency  Intermittent         [facility] PT Assessment - 12/09/17 0001      Sensation   Light Touch  Impaired Detail    Light Touch Impaired Details  Impaired RLE entire lateral right thigh      Coordination   Gross Motor Movements are Fluid and Coordinated  Yes    Fine Motor Movements are Fluid and Coordinated  Yes    Finger Nose Finger Test  symmetrical performance rt vs lt UE      Ambulation/Gait   Ambulation/Gait  Yes    Ambulation/Gait Assistance  4: Min guard    Ambulation Distance (Feet)  50 Feet 60, 50    Assistive device  Straight cane    Gait Pattern  Step-through pattern;Decreased weight shift  to right;Narrow base of support dirift to left    Ambulation Surface  Indoor         Vestibular Assessment - 12/09/17 1307      Vestibular Assessment   General Observation  using cane all the time since left ED and imbalance has been worse      Symptom Behavior   Type of Dizziness  "Horizon moves"    Frequency of Dizziness  constant since late Tues night/early Wed morning    Duration of Dizziness  constant     Aggravating Factors  Moving eyes    Relieving Factors  No known relieving factors      Occulomotor Exam   Occulomotor Alignment  Normal    Spontaneous  Absent    Gaze-induced  Right beating nystagmus with R gaze 3 beats WNL    Smooth Pursuits  Saccades    Saccades  Dysmetria    Comment  convergence ~30 cm (normal 10 cm)      Vestibulo-Occular Reflex   VOR Cancellation  Corrective saccades    Comment  HIT: ?+ to left (limited neck ROM makes test difficult)      Positional Testing   Sidelying Test  Sidelying  Right;Sidelying Left    Horizontal Canal Testing  Horizontal Canal Right;Horizontal Canal Left      Sidelying Right   Sidelying Right Duration  no change    Sidelying Right Symptoms  No nystagmus      Sidelying Left   Sidelying Left Duration  0    Sidelying Left Symptoms  No nystagmus      Horizontal Canal Right   Horizontal Canal Right Duration  persistent     Horizontal Canal Right Symptoms  Ageotrophic      Horizontal Canal Left   Horizontal Canal Left Duration  0    Horizontal Canal Left Symptoms  Normal                       PT Education - 12/09/17 1508    Education Details  concern re: recent changes with her vertigo (persistent for days; oculomotor changes; vision changes) which are more indicative of central vestibulopathy; recommend contact her PCP to be seen and be assessed; discussed if symptoms worsen to call 911 to go to the ED (incr nausea, vomiting, worse vision, dyscoordination, inability to walk, etc)    Person(s) Educated  Patient    Methods  Explanation    Comprehension  Verbalized understanding          PT Long Term Goals - 11/24/17 1604      PT LONG TERM GOAL #1   Title  Pt will report 0/10 dizziness with bed mobility, sit <> stand and rolling to R side    Time  4    Period  Weeks    Status  New    Target Date  12/24/17      PT LONG TERM GOAL #2   Title  Pt will demonstrate negative positional testing in [place]    Baseline  R posterior canal BPPV    Time  4    Period  Weeks    Status  New    Target Date  12/24/17      PT LONG TERM GOAL #3   Title  Pt will be independent with home self treatment (Epley or Nestor Lewandowsky)    Time  4    Period  Weeks    Status  New  Target Date  12/24/17            Plan - 12/09/17 1518    Clinical Impression Statement  Patient presented immediately stating that she has had a set back and "something is different." (See subjective). Patient re-assessed as the description of her  symptoms was very different than her presenting BPPV symptoms. This clinician (using Frenzel lenses) noted no resting nystagmus, however right pupil 14mm with left 99mm (pt reports she has never been told her pupils were different sizes). Later in session they were noted to be equal in size. Evaluating PT Misty Stanley) able to come assess pt and compare to her previous assessment. Pt found to primarily describe visual changes "things are moving back and forth, sometimes it is up and down." Noted to have abnormal smooth pursuits (saccadic), abnormal saccades, abnormal VOR cancellation ("catch up saccades"), abnormal convergence, and reporting diplopia at times throughout the exam. She did not display ataxia or decr coordination. BP WNL. Due to onset of several central signs, explained cannot rule out a central/brain cause of symptoms and want her to be seen by a physician. Do not feel she is in immediate jeopardy as her symptoms began 2 days ago and have improved, therefore did not insist patient go to the ED. Discussed if symptoms worsen, she needs to call 911 and go to ED. Patient verbalized understanding and agreed to call her PCP.     PT Duration  4 weeks    PT Treatment/Interventions  ADLs/Self Care Home Management;Canalith Repostioning;Therapeutic activities;Therapeutic exercise;Balance training;Neuromuscular re-education;Patient/family education;Vestibular    PT Next Visit Plan  ? was pt seen by PCP re: central signs on 12/10/27; Reassess and treat as indicated. Especially look for +rt roll test with apogeotropic nystagmus. *Did not teach home maneuver due to significant neck tightness- review habituation and gaze exercise.    Consulted and Agree with Plan of Care  Patient       Patient will benefit from skilled therapeutic intervention in order to improve the following deficits and impairments:  Decreased balance, Difficulty walking, Dizziness  Visit Diagnosis: Dizziness and giddiness  Unsteadiness  on feet     Problem List Patient Active Problem List   Diagnosis Date Noted  . Asthma with acute exacerbation 09/22/2015  . GERD (gastroesophageal reflux disease) 09/22/2015  . Moderate persistent asthma 06/10/2015  . Chronic rhinitis 06/10/2015  . Near syncope 10/12/2013  . Vertigo 10/12/2013  . Meningioma ([place]) 10/12/2013  . Irritable bowel syndrome 10/12/2013  . Frequent PVCs 10/12/2013  . Hypotension, unspecified 10/12/2013  . Acute kidney injury ([place]) 10/12/2013    Rexanne Mano, PT 12/09/2017, 3:40 PM  [facility] [address] [place], [place], [address] Phone: [phone]   Fax:  [phone]  Name: Carla [name] MRN: [id number] Date of Birth: [birth year]

## 2017-12-14 DIAGNOSIS — K5900 Constipation, unspecified: Secondary | ICD-10-CM | POA: Diagnosis not present

## 2017-12-14 DIAGNOSIS — Z6831 Body mass index (BMI) 31.0-31.9, adult: Secondary | ICD-10-CM | POA: Diagnosis not present

## 2017-12-14 DIAGNOSIS — R112 Nausea with vomiting, unspecified: Secondary | ICD-10-CM | POA: Diagnosis not present

## 2017-12-15 ENCOUNTER — Ambulatory Visit: Payer: Medicare Other | Admitting: Rehabilitative and Restorative Service Providers"

## 2017-12-16 DIAGNOSIS — Z6830 Body mass index (BMI) 30.0-30.9, adult: Secondary | ICD-10-CM | POA: Diagnosis not present

## 2017-12-16 DIAGNOSIS — R42 Dizziness and giddiness: Secondary | ICD-10-CM | POA: Diagnosis not present

## 2017-12-16 DIAGNOSIS — K5909 Other constipation: Secondary | ICD-10-CM | POA: Diagnosis not present

## 2017-12-16 DIAGNOSIS — K589 Irritable bowel syndrome without diarrhea: Secondary | ICD-10-CM | POA: Diagnosis not present

## 2017-12-16 DIAGNOSIS — F418 Other specified anxiety disorders: Secondary | ICD-10-CM | POA: Diagnosis not present

## 2017-12-16 DIAGNOSIS — R112 Nausea with vomiting, unspecified: Secondary | ICD-10-CM | POA: Diagnosis not present

## 2017-12-21 DIAGNOSIS — M5416 Radiculopathy, lumbar region: Secondary | ICD-10-CM | POA: Diagnosis not present

## 2017-12-22 ENCOUNTER — Ambulatory Visit: Payer: Medicare Other | Admitting: Rehabilitative and Restorative Service Providers"

## 2017-12-26 NOTE — Progress Notes (Signed)
Cardiology Office Note   Date:  12/27/2017   ID:  Carla [name], DOB 1936/04/11, MRN [id number]  PCP:  Prince Solian, MD  Cardiologist:   Jenkins Rouge, MD   No chief complaint on file.     History of Present Illness: Carla [name] is a 82 y.o. female who presents for pre op consultation Referred by Dr Veverly Fells She has chronic vertigo, reflux asthma and allergic rhinitis No known CAD, vascular disease CRF;s include HLD on statin and HTN on Benicar  She reports that she has been experiencing dyspnea, chest tightness, and wheezing with mild/moderate exertion, such as walking up an incline.  During these times, she takes albuterol with symptom relief.  She also has back pain with lumbar disc disease by recent CT reviewed films from 12/07/17  Hospitalized 2015 for syncope no cardiac cause found TTE reviewed from that admission normal EF 55-60% mild MR  She is fearful of general anesthesia although last time she had it for GB surgery  She did fine She is getting an epidural for her back on Friday with Dr Nelva Bush She has scoliosis which has given her a lot of muscular pain over the years       Past Medical History:  Diagnosis Date  . Arthritis   . Asthma   . Atypical chest pain   . Back pain   . DJD (degenerative joint disease)   . Goiter   . Gout   . Hyperlipidemia   . Hypertension   . Hypothyroidism   . IBS (irritable bowel syndrome)   . Interstitial cystitis   . Meningioma ([place])   . PVC (premature ventricular contraction)   . Shingles     Past Surgical History:  Procedure Laterality Date  . CHOLECYSTECTOMY  2003  . FINGER SURGERY  1990  . FOOT SURGERY  2004  . WRIST SURGERY  1955     Current Outpatient Medications  Medication Sig Dispense Refill  . budesonide-formoterol (SYMBICORT) 160-4.5 MCG/ACT inhaler Inhale 2 puffs into the lungs 2 (two) times daily.    . famotidine (PEPCID) 10 MG tablet Take 10 mg by mouth 2 (two) times daily.    Marland Kitchen FIBER PO  Take 1 tablet by mouth at bedtime.    . flurazepam (DALMANE) 15 MG capsule Take 15 mg by mouth 3 (three) times daily.    . fluticasone (VERAMYST) 27.5 MCG/SPRAY nasal spray Place 2 sprays into the nose daily.    Marland Kitchen HYDROcodone-acetaminophen (NORCO) 5-325 MG tablet Take 1-2 tablets by mouth every 6 (six) hours as needed. 15 tablet 0  . hydrOXYzine (ATARAX/VISTARIL) 25 MG tablet Take 25 mg by mouth at bedtime.    . levalbuterol (XOPENEX HFA) 45 MCG/ACT inhaler Inhale 2 puffs into the lungs every 4 (four) hours as needed for wheezing. 1 Inhaler 0  . levothyroxine (SYNTHROID, LEVOTHROID) 100 MCG tablet Take 100 mcg by mouth daily before breakfast.    . Linaclotide (LINZESS) 145 MCG CAPS capsule Take 145 mcg by mouth at bedtime.    . montelukast (SINGULAIR) 10 MG tablet Take 10 mg by mouth at bedtime. Reported on 09/22/2015    . Multiple Vitamin (MULTIVITAMIN WITH MINERALS) TABS tablet Take 1 tablet by mouth daily.    Marland Kitchen olmesartan (BENICAR) 20 MG tablet Take 20 mg by mouth daily.    . ondansetron (ZOFRAN ODT) 8 MG disintegrating tablet 8mg  ODT q4 hours prn nausea 10 tablet 0  . pentosan polysulfate (ELMIRON) 100 MG capsule Take 100 mg  by mouth 3 (three) times daily.    . simvastatin (ZOCOR) 40 MG tablet Take 40 mg by mouth at bedtime.    Marland Kitchen umeclidinium bromide (INCRUSE ELLIPTA) 62.5 MCG/INH AEPB Inhale 1 puff into the lungs daily. 30 each 5   No current facility-administered medications for this visit.     Allergies:   Ceclor [cefaclor]; Darvon [propoxyphene]; and Doxycycline    Social History:  The patient  reports that she quit smoking about 33 years ago. Her smoking use included cigarettes. She has never used smokeless tobacco. She reports that she drinks alcohol. She reports that she does not use drugs.   Family History:  The patient's family history includes Arthritis in her father; COPD in her father; Heart failure in her father; Hypertension in her father; Other in her mother.    ROS:   Please see the history of present illness.   Otherwise, review of systems are positive for none.   All other systems are reviewed and negative.    PHYSICAL EXAM: VS:  BP 118/70   Pulse 91   Ht 5\' 1"  (1.549 m)   Wt 162 lb 4 oz (73.6 kg)   SpO2 97%   BMI 30.66 kg/m  , BMI Body mass index is 30.66 kg/m. Affect appropriate Healthy:  appears stated age 89: normal Neck supple with no adenopathy JVP normal no bruits no thyromegaly Lungs clear with no wheezing and good diaphragmatic motion Heart:  S1/S2 no murmur, no rub, gallop or click PMI normal Abdomen: benighn, BS positve, no tenderness, no AAA no bruit.  No HSM or HJR Distal pulses intact with no bruits No edema Neuro non-focal Skin warm and dry No muscular weakness    EKG:  2015 SR PVC normal ST segments 12/27/17 SR rate 78 LAD poor R wave progression   Recent Labs: 12/07/2017: BUN 15; Creatinine, Ser 1.05; Hemoglobin 14.2; Platelets 199; Potassium 3.2; Sodium 142    Lipid Panel No results found for: CHOL, TRIG, HDL, CHOLHDL, VLDL, LDLCALC, LDLDIRECT    Wt Readings from Last 3 Encounters:  12/27/17 162 lb 4 oz (73.6 kg)  12/06/17 162 lb (73.5 kg)  11/22/17 162 lb (73.5 kg)      Other studies Reviewed: Additional studies/ records that were reviewed today include: Notes from ortho, and primary as well as allergy Lumbar CT TTE 2015 ECG 2015 . Labs 12/07/17    ASSESSMENT AND PLAN:  1.  Preop with dyspnea and exertional chest pain will order Lexi myovue to clear for Surgery she cannot walk on treadmill due to back and leg pain  2. HTN:  Well controlled.  Continue current medications and low sodium Dash type diet.   3. HLD continue statin  4. Asthma chronic no active wheezing since she just had prednisone TTE to assess RV and LV function given dyspnea  5. Vertigo PRN meclizine and zofran stable  Will clear for right rotator cuff surgery with Dr Veverly Fells if above tests ok     Current medicines are reviewed at  length with the patient today.  The patient does not have concerns regarding medicines.  The following changes have been made:  no change  Labs/ tests ordered today include: Lexi Myovue and TTE  No orders of the defined types were placed in this encounter.    Disposition:   FU with cardiology PRN      Signed, Jenkins Rouge, MD  12/27/2017 3:58 PM    [place],  , [place]  [address] Phone: [phone]; Fax: [phone]  

## 2017-12-27 ENCOUNTER — Encounter: Payer: Self-pay | Admitting: Cardiovascular Disease

## 2017-12-27 ENCOUNTER — Ambulatory Visit (INDEPENDENT_AMBULATORY_CARE_PROVIDER_SITE_OTHER): Payer: Medicare Other | Admitting: Cardiovascular Disease

## 2017-12-27 ENCOUNTER — Telehealth: Payer: Self-pay

## 2017-12-27 VITALS — BP 118/70 | HR 91 | Ht 61.0 in | Wt 162.2 lb

## 2017-12-27 DIAGNOSIS — R0789 Other chest pain: Secondary | ICD-10-CM | POA: Diagnosis not present

## 2017-12-27 MED ORDER — UMECLIDINIUM BROMIDE 62.5 MCG/INH IN AEPB: 1.0000 | INHALATION_SPRAY | Freq: Every day | RESPIRATORY_TRACT | 5 refills | Status: DC

## 2017-12-27 NOTE — Addendum Note (Signed)
Addended by: Lucrezia Starch I on: 12/27/2017 01:31 PM   Modules accepted: Orders

## 2017-12-27 NOTE — Telephone Encounter (Signed)
Left detailed message for patient advising of medication change and reason.

## 2017-12-27 NOTE — Telephone Encounter (Signed)
Patient's insurance will not cover Spiriva 1.25 respimat. The alternatives that she must try and fail are Atrovent HFA and Incruse Ellipta. Please advise and thank you.

## 2017-12-27 NOTE — Telephone Encounter (Signed)
Incruse Ellipta, 1 inhalation daily. Thanks.

## 2017-12-27 NOTE — Patient Instructions (Signed)
Medication Instructions:  Your physician recommends that you continue on your current medications as directed. Please refer to the Current Medication list given to you today.  Labwork: NONE  Testing/Procedures: Your physician has requested that you have an echocardiogram. Echocardiography is a painless test that uses sound waves to create images of your heart. It provides your doctor with information about the size and shape of your heart and how well your heart's chambers and valves are working. This procedure takes approximately one hour. There are no restrictions for this procedure.  Your physician has requested that you have a lexiscan myoview. For further information please visit [email or web address]. Please follow instruction sheet, as given.  Follow-Up: Your physician wants you to follow-up as needed with Dr. Johnsie Cancel.   If you need a refill on your cardiac medications before your next appointment, please call your pharmacy.

## 2017-12-30 DIAGNOSIS — M5137 Other intervertebral disc degeneration, lumbosacral region: Secondary | ICD-10-CM | POA: Diagnosis not present

## 2018-01-01 ENCOUNTER — Other Ambulatory Visit: Payer: Self-pay | Admitting: Allergy and Immunology

## 2018-01-05 ENCOUNTER — Telehealth (HOSPITAL_COMMUNITY): Payer: Self-pay | Admitting: *Deleted

## 2018-01-05 NOTE — Telephone Encounter (Signed)
Patient given detailed instructions per Myocardial Perfusion Study Information Sheet for the test on 01/10/18. Patient notified to arrive 15 minutes early and that it is imperative to arrive on time for appointment to keep from having the test rescheduled.  If you need to cancel or reschedule your appointment, please call the office within 24 hours of your appointment. . Patient verbalized understanding. [name]

## 2018-01-10 ENCOUNTER — Ambulatory Visit (HOSPITAL_BASED_OUTPATIENT_CLINIC_OR_DEPARTMENT_OTHER): Payer: Medicare Other

## 2018-01-10 ENCOUNTER — Ambulatory Visit (HOSPITAL_COMMUNITY): Payer: Medicare Other | Attending: Cardiology

## 2018-01-10 DIAGNOSIS — E785 Hyperlipidemia, unspecified: Secondary | ICD-10-CM | POA: Insufficient documentation

## 2018-01-10 DIAGNOSIS — R0789 Other chest pain: Secondary | ICD-10-CM

## 2018-01-10 DIAGNOSIS — I10 Essential (primary) hypertension: Secondary | ICD-10-CM | POA: Diagnosis not present

## 2018-01-10 LAB — MYOCARDIAL PERFUSION IMAGING
LV dias vol: 62 mL (ref 46–106)
LV sys vol: 23 mL
Peak HR: 91 {beats}/min
RATE: 0.36
Rest HR: 77 {beats}/min
SDS: 6
SRS: 12
SSS: 18
TID: 1.01

## 2018-01-10 MED ORDER — REGADENOSON 0.4 MG/5ML IV SOLN
0.4000 mg | Freq: Once | INTRAVENOUS | Status: AC
  Administered 2018-01-10: 0.4 mg via INTRAVENOUS

## 2018-01-10 MED ORDER — TECHNETIUM TC 99M TETROFOSMIN IV KIT
10.2000 | PACK | Freq: Once | INTRAVENOUS | Status: AC | PRN
  Administered 2018-01-10: 10.2 via INTRAVENOUS
  Filled 2018-01-10: qty 11

## 2018-01-10 MED ORDER — TECHNETIUM TC 99M TETROFOSMIN IV KIT
32.3000 | PACK | Freq: Once | INTRAVENOUS | Status: AC | PRN
  Administered 2018-01-10: 32.3 via INTRAVENOUS
  Filled 2018-01-10: qty 33

## 2018-01-14 DIAGNOSIS — M48061 Spinal stenosis, lumbar region without neurogenic claudication: Secondary | ICD-10-CM | POA: Diagnosis not present

## 2018-01-14 DIAGNOSIS — M545 Low back pain: Secondary | ICD-10-CM | POA: Diagnosis not present

## 2018-01-14 DIAGNOSIS — M5136 Other intervertebral disc degeneration, lumbar region: Secondary | ICD-10-CM | POA: Diagnosis not present

## 2018-01-25 DIAGNOSIS — M545 Low back pain: Secondary | ICD-10-CM | POA: Diagnosis not present

## 2018-02-07 DIAGNOSIS — R490 Dysphonia: Secondary | ICD-10-CM | POA: Diagnosis not present

## 2018-02-07 DIAGNOSIS — K219 Gastro-esophageal reflux disease without esophagitis: Secondary | ICD-10-CM | POA: Diagnosis not present

## 2018-02-07 DIAGNOSIS — H6123 Impacted cerumen, bilateral: Secondary | ICD-10-CM | POA: Diagnosis not present

## 2018-02-08 DIAGNOSIS — M545 Low back pain: Secondary | ICD-10-CM | POA: Diagnosis not present

## 2018-02-10 DIAGNOSIS — M545 Low back pain: Secondary | ICD-10-CM | POA: Diagnosis not present

## 2018-02-15 DIAGNOSIS — M25511 Pain in right shoulder: Secondary | ICD-10-CM | POA: Diagnosis not present

## 2018-02-22 DIAGNOSIS — M545 Low back pain: Secondary | ICD-10-CM | POA: Diagnosis not present

## 2018-02-24 DIAGNOSIS — M545 Low back pain: Secondary | ICD-10-CM | POA: Diagnosis not present

## 2018-02-27 DIAGNOSIS — M545 Low back pain: Secondary | ICD-10-CM | POA: Diagnosis not present

## 2018-03-01 DIAGNOSIS — M545 Low back pain: Secondary | ICD-10-CM | POA: Diagnosis not present

## 2018-03-06 DIAGNOSIS — M545 Low back pain: Secondary | ICD-10-CM | POA: Diagnosis not present

## 2018-03-07 DIAGNOSIS — R490 Dysphonia: Secondary | ICD-10-CM | POA: Diagnosis not present

## 2018-03-22 DIAGNOSIS — J383 Other diseases of vocal cords: Secondary | ICD-10-CM | POA: Diagnosis not present

## 2018-03-22 DIAGNOSIS — I10 Essential (primary) hypertension: Secondary | ICD-10-CM | POA: Diagnosis not present

## 2018-03-22 DIAGNOSIS — E7849 Other hyperlipidemia: Secondary | ICD-10-CM | POA: Diagnosis not present

## 2018-03-22 DIAGNOSIS — E559 Vitamin D deficiency, unspecified: Secondary | ICD-10-CM | POA: Diagnosis not present

## 2018-03-22 DIAGNOSIS — Z87891 Personal history of nicotine dependence: Secondary | ICD-10-CM | POA: Diagnosis not present

## 2018-03-22 DIAGNOSIS — M109 Gout, unspecified: Secondary | ICD-10-CM | POA: Diagnosis not present

## 2018-03-22 DIAGNOSIS — J3801 Paralysis of vocal cords and larynx, unilateral: Secondary | ICD-10-CM | POA: Diagnosis not present

## 2018-03-22 DIAGNOSIS — J386 Stenosis of larynx: Secondary | ICD-10-CM | POA: Diagnosis not present

## 2018-03-22 DIAGNOSIS — E038 Other specified hypothyroidism: Secondary | ICD-10-CM | POA: Diagnosis not present

## 2018-03-24 DIAGNOSIS — I10 Essential (primary) hypertension: Secondary | ICD-10-CM | POA: Diagnosis not present

## 2018-03-24 DIAGNOSIS — Z6830 Body mass index (BMI) 30.0-30.9, adult: Secondary | ICD-10-CM | POA: Diagnosis not present

## 2018-03-24 DIAGNOSIS — H26491 Other secondary cataract, right eye: Secondary | ICD-10-CM | POA: Diagnosis not present

## 2018-03-24 DIAGNOSIS — G478 Other sleep disorders: Secondary | ICD-10-CM | POA: Diagnosis not present

## 2018-03-24 DIAGNOSIS — N183 Chronic kidney disease, stage 3 (moderate): Secondary | ICD-10-CM | POA: Diagnosis not present

## 2018-03-28 ENCOUNTER — Encounter: Payer: Self-pay | Admitting: Allergy and Immunology

## 2018-03-28 ENCOUNTER — Ambulatory Visit (INDEPENDENT_AMBULATORY_CARE_PROVIDER_SITE_OTHER): Payer: Medicare Other | Admitting: Allergy and Immunology

## 2018-03-28 VITALS — BP 140/94 | HR 72 | Resp 16

## 2018-03-28 DIAGNOSIS — I10 Essential (primary) hypertension: Secondary | ICD-10-CM | POA: Diagnosis not present

## 2018-03-28 DIAGNOSIS — J310 Chronic rhinitis: Secondary | ICD-10-CM | POA: Diagnosis not present

## 2018-03-28 DIAGNOSIS — J4540 Moderate persistent asthma, uncomplicated: Secondary | ICD-10-CM

## 2018-03-28 DIAGNOSIS — K219 Gastro-esophageal reflux disease without esophagitis: Secondary | ICD-10-CM | POA: Diagnosis not present

## 2018-03-28 MED ORDER — BUDESONIDE-FORMOTEROL FUMARATE 160-4.5 MCG/ACT IN AERO: 2.0000 | INHALATION_SPRAY | Freq: Two times a day (BID) | RESPIRATORY_TRACT | 5 refills | Status: AC

## 2018-03-28 MED ORDER — FLUTICASONE PROPIONATE 50 MCG/ACT NA SUSP: 2.0000 | Freq: Every day | NASAL | 5 refills | Status: AC

## 2018-03-28 NOTE — Assessment & Plan Note (Signed)
   Patient has been informed of elevated blood pressure and has been asked to follow up with primary care physician in the near future regarding this issue.

## 2018-03-28 NOTE — Patient Instructions (Addendum)
Moderate persistent asthma Stable.  Continue Symbicort 160-4.5 g, 2 inhalations via spacer device twice daily.  I recommended using the Incruse Ellipta 1 inhalation daily on a scheduled rather than as needed basis, particularly from now until she has had her surgical procedure.  Continue albuterol HFA, 1 to 2 inhalations every 4-6 hours if needed.  Subjective and objective measures of pulmonary function will be followed and the treatment plan will be adjusted accordingly.  Chronic rhinitis  A refill prescription has been provided for fluticasone nasal spray, 2 sprays per nostril daily as needed.  Nasal saline spray (i.e., Simply Saline) or nasal saline lavage (i.e., NeilMed) is recommended as needed and prior to medicated nasal sprays.  Hypertension  Patient has been informed of elevated blood pressure and has been asked to follow up with primary care physician in the near future regarding this issue.   Return in about 5 months (around 08/28/2018), or if symptoms worsen or fail to improve.

## 2018-03-28 NOTE — Progress Notes (Signed)
Follow-up Note  RE: Carla [name] MRN: [id number] DOB: 03/26/1936 Date of Office Visit: 03/28/2018  Primary care provider: Prince Solian, MD Referring provider: Prince Solian, MD  History of present illness: Carla [name] is a 82 y.o. female with persistent asthma, allergic rhinitis, and gastroesophageal reflux presenting today for follow-up.  She was last seen in this clinic on November 22, 2017.  She reports that she will be having vocal cord surgery on May 01, 2018.  She reports that in the interval since her previous visit her asthma has been well controlled while taking Symbicort 160- 4.5 g, 2 inhalations via spacer device twice daily.  She admits that she has only been using Incruse Ellipta on a sporadic rather than daily scheduled basis.  She rarely requires albuterol rescue, though she notes that she has not been very physically active recently due to issues with degenerative disc disease.  She has not been experiencing nocturnal awakenings due to lower respiratory symptoms or limitations in normal daily activities due to asthma symptoms.  Her nasal allergy symptoms have been relatively well controlled though she has been experiencing some mild nasal congestion recently.  Assessment and plan: Moderate persistent asthma Stable.  Continue Symbicort 160-4.5 g, 2 inhalations via spacer device twice daily.  I recommended using the Incruse Ellipta 1 inhalation daily on a scheduled rather than as needed basis, particularly from now until she has had her surgical procedure.  Continue albuterol HFA, 1 to 2 inhalations every 4-6 hours if needed.  Subjective and objective measures of pulmonary function will be followed and the treatment plan will be adjusted accordingly.  Chronic rhinitis  A refill prescription has been provided for fluticasone nasal spray, 2 sprays per nostril daily as needed.  Nasal saline spray (i.e., Simply Saline) or nasal saline lavage (i.e.,  NeilMed) is recommended as needed and prior to medicated nasal sprays.  Hypertension  Patient has been informed of elevated blood pressure and has been asked to follow up with primary care physician in the near future regarding this issue.   Meds ordered this encounter  Medications  . budesonide-formoterol (SYMBICORT) 160-4.5 MCG/ACT inhaler    Sig: Inhale 2 puffs into the lungs 2 (two) times daily.    Dispense:  10.2 g    Refill:  5  . fluticasone (FLONASE) 50 MCG/ACT nasal spray    Sig: Place 2 sprays into both nostrils daily.    Dispense:  16 g    Refill:  5    Diagnostics: Spirometry reveals an FVC of 1.54 L and an FEV1 of 1.31 L (79% predicted) with an FEV1 ratio of 115%.  Her FEV1 is consistent with previous study in April 2019 when she had an FEV1 of 78% predicted without significant postbronchodilator improvement.  Please see scanned spirometry results for details.    Physical examination: Blood pressure (!) 140/94, pulse 72, resp. rate 16.  General: Alert, interactive, in no acute distress. HEENT: TMs pearly gray, turbinates minimally edematous without discharge, post-pharynx moderately erythematous. Neck: Supple without lymphadenopathy. Lungs: Clear to auscultation without wheezing, rhonchi or rales. CV: Normal S1, S2 without murmurs. Skin: Warm and dry, without lesions or rashes.  The following portions of the patient's history were reviewed and updated as appropriate: allergies, current medications, past family history, past medical history, past social history, past surgical history and problem list.  Allergies as of 03/28/2018      Reactions   Ceclor [cefaclor]    unknown   Darvon [propoxyphene]  unknown   Doxycycline    unknown   Temazepam Anxiety      Medication List        Accurate as of 03/28/18 10:09 PM. Always use your most recent med list.          allopurinol 300 MG tablet Commonly known as:  ZYLOPRIM allopurinol 300 mg tablet     amitriptyline 25 MG tablet Commonly known as:  ELAVIL amitriptyline 25 mg tablet   BENICAR 20 MG tablet Generic drug:  olmesartan Take 20 mg by mouth daily.   budesonide-formoterol 160-4.5 MCG/ACT inhaler Commonly known as:  SYMBICORT Inhale 2 puffs into the lungs 2 (two) times daily.   famotidine 10 MG tablet Commonly known as:  PEPCID Take 10 mg by mouth 2 (two) times daily.   FIBER PO Take 1 tablet by mouth at bedtime.   flurazepam 15 MG capsule Commonly known as:  DALMANE Take 15 mg by mouth 3 (three) times daily.   fluticasone 27.5 MCG/SPRAY nasal spray Commonly known as:  VERAMYST Place 2 sprays into the nose daily.   fluticasone 50 MCG/ACT nasal spray Commonly known as:  FLONASE Place 2 sprays into both nostrils daily.   HYDROcodone-acetaminophen 5-325 MG tablet Commonly known as:  NORCO/VICODIN Take 1-2 tablets by mouth every 6 (six) hours as needed.   hydrOXYzine 25 MG tablet Commonly known as:  ATARAX/VISTARIL Take 25 mg by mouth at bedtime.   levalbuterol 45 MCG/ACT inhaler Commonly known as:  XOPENEX HFA Inhale 2 puffs into the lungs every 4 (four) hours as needed for wheezing.   levothyroxine 100 MCG tablet Commonly known as:  SYNTHROID, LEVOTHROID Take 100 mcg by mouth daily before breakfast.   LINZESS 145 MCG Caps capsule Generic drug:  linaclotide Take 145 mcg by mouth at bedtime.   montelukast 10 MG tablet Commonly known as:  SINGULAIR Take 10 mg by mouth at bedtime. Reported on 09/22/2015   multivitamin with minerals Tabs tablet Take 1 tablet by mouth daily.   ondansetron 8 MG disintegrating tablet Commonly known as:  ZOFRAN-ODT 8mg  ODT q4 hours prn nausea   pentosan polysulfate 100 MG capsule Commonly known as:  ELMIRON Take 100 mg by mouth 3 (three) times daily.   simvastatin 40 MG tablet Commonly known as:  ZOCOR Take 40 mg by mouth at bedtime.   umeclidinium bromide 62.5 MCG/INH Aepb Commonly known as:  INCRUSE  ELLIPTA Inhale 1 puff into the lungs daily.       Allergies  Allergen Reactions  . Ceclor [Cefaclor]     unknown  . Darvon [Propoxyphene]     unknown  . Doxycycline     unknown  . Temazepam Anxiety   Review of systems: Review of systems negative except as noted in HPI / PMHx or noted below: Constitutional: Negative.  HENT: Negative.   Eyes: Negative.  Respiratory: Negative.   Cardiovascular: Negative.  Gastrointestinal: Negative.  Genitourinary: Negative.  Musculoskeletal: Negative.  Neurological: Negative.  Endo/Heme/Allergies: Negative.  Cutaneous: Negative.  Past Medical History:  Diagnosis Date  . Arthritis   . Asthma   . Atypical chest pain   . Back pain   . DJD (degenerative joint disease)   . Goiter   . Gout   . Hyperlipidemia   . Hypertension   . Hypothyroidism   . IBS (irritable bowel syndrome)   . Interstitial cystitis   . Meningioma ([place])   . PVC (premature ventricular contraction)   . Shingles     Family History  Problem Relation Age of Onset  . Other Mother        Died of Toxemia at the age of 64  . Heart failure Father   . COPD Father   . Arthritis Father   . Hypertension Father   . Allergic rhinitis Neg Hx   . Angioedema Neg Hx   . Asthma Neg Hx   . Eczema Neg Hx   . Immunodeficiency Neg Hx   . Urticaria Neg Hx     Social History   Socioeconomic History  . Marital status: Married    Spouse name: Not on file  . Number of children: Not on file  . Years of education: Not on file  . Highest education level: Not on file  Occupational History  . Not on file  Social Needs  . Financial resource strain: Not on file  . Food insecurity:    Worry: Not on file    Inability: Not on file  . Transportation needs:    Medical: Not on file    Non-medical: Not on file  Tobacco Use  . Smoking status: Former Smoker    Types: Cigarettes    Last attempt to quit: 1986    Years since quitting: 33.6  . Smokeless tobacco: Never Used   Substance and Sexual Activity  . Alcohol use: Yes  . Drug use: Never  . Sexual activity: Not on file  Lifestyle  . Physical activity:    Days per week: Not on file    Minutes per session: Not on file  . Stress: Not on file  Relationships  . Social connections:    Talks on phone: Not on file    Gets together: Not on file    Attends religious service: Not on file    Active member of club or organization: Not on file    Attends meetings of clubs or organizations: Not on file    Relationship status: Not on file  . Intimate partner violence:    Fear of current or ex partner: Not on file    Emotionally abused: Not on file    Physically abused: Not on file    Forced sexual activity: Not on file  Other Topics Concern  . Not on file  Social History Narrative  . Not on file    I appreciate the opportunity to take part in Vanda's care. Please do not hesitate to contact me with questions.  Sincerely,   R. Edgar Frisk, MD

## 2018-03-28 NOTE — Assessment & Plan Note (Signed)
Stable.  Continue Symbicort 160-4.5 g, 2 inhalations via spacer device twice daily.  I recommended using the Incruse Ellipta 1 inhalation daily on a scheduled rather than as needed basis, particularly from now until she has had her surgical procedure.  Continue albuterol HFA, 1 to 2 inhalations every 4-6 hours if needed.  Subjective and objective measures of pulmonary function will be followed and the treatment plan will be adjusted accordingly.

## 2018-03-28 NOTE — Assessment & Plan Note (Signed)
   A refill prescription has been provided for fluticasone nasal spray, 2 sprays per nostril daily as needed.  Nasal saline spray (i.e., Simply Saline) or nasal saline lavage (i.e., NeilMed) is recommended as needed and prior to medicated nasal sprays.

## 2018-03-29 DIAGNOSIS — E038 Other specified hypothyroidism: Secondary | ICD-10-CM | POA: Diagnosis not present

## 2018-03-29 DIAGNOSIS — J45998 Other asthma: Secondary | ICD-10-CM | POA: Diagnosis not present

## 2018-03-29 DIAGNOSIS — I10 Essential (primary) hypertension: Secondary | ICD-10-CM | POA: Diagnosis not present

## 2018-03-29 DIAGNOSIS — M859 Disorder of bone density and structure, unspecified: Secondary | ICD-10-CM | POA: Diagnosis not present

## 2018-03-29 DIAGNOSIS — J3089 Other allergic rhinitis: Secondary | ICD-10-CM | POA: Diagnosis not present

## 2018-03-29 DIAGNOSIS — Z23 Encounter for immunization: Secondary | ICD-10-CM | POA: Diagnosis not present

## 2018-03-29 DIAGNOSIS — E668 Other obesity: Secondary | ICD-10-CM | POA: Diagnosis not present

## 2018-03-29 DIAGNOSIS — E7849 Other hyperlipidemia: Secondary | ICD-10-CM | POA: Diagnosis not present

## 2018-03-29 DIAGNOSIS — Z0000 Encounter for general adult medical examination without abnormal findings: Secondary | ICD-10-CM | POA: Diagnosis not present

## 2018-03-29 DIAGNOSIS — Z6830 Body mass index (BMI) 30.0-30.9, adult: Secondary | ICD-10-CM | POA: Diagnosis not present

## 2018-03-29 DIAGNOSIS — Z1389 Encounter for screening for other disorder: Secondary | ICD-10-CM | POA: Diagnosis not present

## 2018-03-29 DIAGNOSIS — N183 Chronic kidney disease, stage 3 (moderate): Secondary | ICD-10-CM | POA: Diagnosis not present

## 2018-04-13 DIAGNOSIS — M859 Disorder of bone density and structure, unspecified: Secondary | ICD-10-CM | POA: Diagnosis not present

## 2018-04-19 DIAGNOSIS — J387 Other diseases of larynx: Secondary | ICD-10-CM | POA: Diagnosis not present

## 2018-04-19 DIAGNOSIS — R911 Solitary pulmonary nodule: Secondary | ICD-10-CM | POA: Diagnosis not present

## 2018-04-19 DIAGNOSIS — J383 Other diseases of vocal cords: Secondary | ICD-10-CM | POA: Diagnosis not present

## 2018-04-19 DIAGNOSIS — J3800 Paralysis of vocal cords and larynx, unspecified: Secondary | ICD-10-CM | POA: Diagnosis not present

## 2018-04-19 DIAGNOSIS — J3801 Paralysis of vocal cords and larynx, unilateral: Secondary | ICD-10-CM | POA: Diagnosis not present

## 2018-05-03 NOTE — Progress Notes (Signed)
Synopsis: Referred in Oct. 2019 for vocal cord paralysis by Prince Solian, MD  Subjective:   PATIENT ID: Carla [name] GENDER: female DOB: [birth year], MRN: [id number]  Chief Complaint  Patient presents with  . Consult    Vocal cord paralysis, abnormal cat scan. States she has asthma.     PMH she developed vocal cord paralysis back in march 2019 and at this point she has almost lost her voice at this point. She has been followed by the voice clinic at [place]. She has asthma and follows with allergy for treatment of her asthma. She takes symbicort daily. She was prescribed incruse and does not use this. She carries her albuterol but does not use it very often. Less than once or twice per month.   During the evaluation of her unilateral vocal cord paralysis a 04/19/2018 CT of the neck was completed that revealed an enlarged subcarinal node and 44mm RLL lung nodule.   Per the patient no one is sure of the vocal cord paralysis. Prior to this she had abdominal pain and severe nausea with vomiting. She has not had any nerve conduction studies.   Father with COPD, Heart failure. Mother died when she was born. No family history of cancers or lymphomas.    Past Medical History:  Diagnosis Date  . Arthritis   . Asthma   . Atypical chest pain   . Back pain   . DJD (degenerative joint disease)   . Goiter   . Gout   . Hyperlipidemia   . Hypertension   . Hypothyroidism   . IBS (irritable bowel syndrome)   . Interstitial cystitis   . Meningioma ([place])   . PVC (premature ventricular contraction)   . Shingles      Family History  Problem Relation Age of Onset  . Other Mother        Died of Toxemia at the age of 53  . Heart failure Father   . COPD Father   . Arthritis Father   . Hypertension Father   . Allergic rhinitis Neg Hx   . Angioedema Neg Hx   . Asthma Neg Hx   . Eczema Neg Hx   . Immunodeficiency Neg Hx   . Urticaria Neg Hx      Past Surgical History:    Procedure Laterality Date  . CHOLECYSTECTOMY  2003  . FINGER SURGERY  1990  . FOOT SURGERY  2004  . WRIST SURGERY  1955    Social History   Socioeconomic History  . Marital status: Married    Spouse name: Not on file  . Number of children: Not on file  . Years of education: Not on file  . Highest education level: Not on file  Occupational History  . Not on file  Social Needs  . Financial resource strain: Not on file  . Food insecurity:    Worry: Not on file    Inability: Not on file  . Transportation needs:    Medical: Not on file    Non-medical: Not on file  Tobacco Use  . Smoking status: Former Smoker    Types: Cigarettes    Last attempt to quit: 1986    Years since quitting: 33.7  . Smokeless tobacco: Never Used  Substance and Sexual Activity  . Alcohol use: Yes  . Drug use: Never  . Sexual activity: Not on file  Lifestyle  . Physical activity:    Days per week: Not on file  Minutes per session: Not on file  . Stress: Not on file  Relationships  . Social connections:    Talks on phone: Not on file    Gets together: Not on file    Attends religious service: Not on file    Active member of club or organization: Not on file    Attends meetings of clubs or organizations: Not on file    Relationship status: Not on file  . Intimate partner violence:    Fear of current or ex partner: Not on file    Emotionally abused: Not on file    Physically abused: Not on file    Forced sexual activity: Not on file  Other Topics Concern  . Not on file  Social History Narrative  . Not on file     Allergies  Allergen Reactions  . Ceclor [Cefaclor]     unknown  . Darvon [Propoxyphene]     unknown  . Doxycycline     unknown  . Temazepam Anxiety     Outpatient Medications Prior to Visit  Medication Sig Dispense Refill  . allopurinol (ZYLOPRIM) 300 MG tablet allopurinol 300 mg tablet    . amitriptyline (ELAVIL) 25 MG tablet amitriptyline 25 mg tablet    .  budesonide-formoterol (SYMBICORT) 160-4.5 MCG/ACT inhaler Inhale 2 puffs into the lungs 2 (two) times daily. 10.2 g 5  . famotidine (PEPCID) 10 MG tablet Take 10 mg by mouth 2 (two) times daily.    Marland Kitchen FIBER PO Take 1 tablet by mouth at bedtime.    . flurazepam (DALMANE) 15 MG capsule Take 15 mg by mouth 3 (three) times daily.    . fluticasone (FLONASE) 50 MCG/ACT nasal spray Place 2 sprays into both nostrils daily. 16 g 5  . fluticasone (VERAMYST) 27.5 MCG/SPRAY nasal spray Place 2 sprays into the nose daily.    Marland Kitchen HYDROcodone-acetaminophen (NORCO) 5-325 MG tablet Take 1-2 tablets by mouth every 6 (six) hours as needed. 15 tablet 0  . hydrOXYzine (ATARAX/VISTARIL) 25 MG tablet Take 25 mg by mouth at bedtime.    . levalbuterol (XOPENEX HFA) 45 MCG/ACT inhaler Inhale 2 puffs into the lungs every 4 (four) hours as needed for wheezing. 1 Inhaler 0  . levothyroxine (SYNTHROID, LEVOTHROID) 100 MCG tablet Take 100 mcg by mouth daily before breakfast.    . Linaclotide (LINZESS) 145 MCG CAPS capsule Take 145 mcg by mouth at bedtime.    . montelukast (SINGULAIR) 10 MG tablet Take 10 mg by mouth at bedtime. Reported on 09/22/2015    . Multiple Vitamin (MULTIVITAMIN WITH MINERALS) TABS tablet Take 1 tablet by mouth daily.    Marland Kitchen olmesartan (BENICAR) 20 MG tablet Take 20 mg by mouth daily.    . ondansetron (ZOFRAN ODT) 8 MG disintegrating tablet 8mg  ODT q4 hours prn nausea 10 tablet 0  . pentosan polysulfate (ELMIRON) 100 MG capsule Take 100 mg by mouth 3 (three) times daily.    . simvastatin (ZOCOR) 40 MG tablet Take 40 mg by mouth at bedtime.    Marland Kitchen umeclidinium bromide (INCRUSE ELLIPTA) 62.5 MCG/INH AEPB Inhale 1 puff into the lungs daily. (Patient not taking: Reported on 05/04/2018) 30 each 5   No facility-administered medications prior to visit.     Review of Systems  Constitutional: Negative for chills, fever, malaise/fatigue and weight loss.  HENT: Negative for hearing loss, sore throat and tinnitus.         Hoarse voice  Eyes: Negative for blurred vision and double vision.  Respiratory: Positive for shortness of breath. Negative for cough, hemoptysis, sputum production, wheezing and stridor.   Cardiovascular: Negative for chest pain, palpitations, orthopnea, leg swelling and PND.  Gastrointestinal: Negative for abdominal pain, constipation, diarrhea, heartburn, nausea and vomiting.  Genitourinary: Negative for dysuria, hematuria and urgency.  Musculoskeletal: Negative for joint pain and myalgias.  Skin: Negative for itching and rash.  Neurological: Negative for dizziness, tingling, weakness and headaches.  Endo/Heme/Allergies: Negative for environmental allergies. Does not bruise/bleed easily.  Psychiatric/Behavioral: Negative for depression. The patient is not nervous/anxious and does not have insomnia.   All other systems reviewed and are negative.    Objective:  Physical Exam  Constitutional: She is oriented to person, place, and time. She appears well-developed and well-nourished. No distress.  HENT:  Head: Normocephalic and atraumatic.  Mouth/Throat: Oropharynx is clear and moist.  Hoarseness of voice  Eyes: Pupils are equal, round, and reactive to light. Conjunctivae are normal. No scleral icterus.  Neck: Neck supple. No JVD present. No tracheal deviation present.  Cardiovascular: Normal rate, regular rhythm, normal heart sounds and intact distal pulses.  No murmur heard. Pulmonary/Chest: Effort normal and breath sounds normal. No accessory muscle usage or stridor. No tachypnea. No respiratory distress. She has no wheezes. She has no rhonchi. She has no rales.  Abdominal: Soft. Bowel sounds are normal. She exhibits no distension. There is no tenderness.  Musculoskeletal: She exhibits no edema or tenderness.  Lymphadenopathy:    She has no cervical adenopathy.  Neurological: She is alert and oriented to person, place, and time.  Skin: Skin is warm and dry. Capillary refill takes  less than 2 seconds. No rash noted.  Psychiatric: She has a normal mood and affect. Her behavior is normal.  Vitals reviewed.    Vitals:   05/04/18 1404  BP: 118/76  Pulse: 96  SpO2: 97%  Height: 5\' 1"  (1.549 m)   97% on RA BMI Readings from Last 3 Encounters:  05/04/18 30.61 kg/m  01/10/18 30.61 kg/m  12/27/17 30.66 kg/m   Wt Readings from Last 3 Encounters:  01/10/18 162 lb (73.5 kg)  12/27/17 162 lb 4 oz (73.6 kg)  12/06/17 162 lb (73.5 kg)     CBC    Component Value Date/Time   WBC 6.6 12/07/2017 0024   RBC 4.59 12/07/2017 0024   HGB 14.2 12/07/2017 0024   HCT 41.6 12/07/2017 0024   PLT 199 12/07/2017 0024   MCV 90.6 12/07/2017 0024   MCH 30.9 12/07/2017 0024   MCHC 34.1 12/07/2017 0024   RDW 13.7 12/07/2017 0024   LYMPHSABS 1.3 12/07/2017 0024   MONOABS 0.4 12/07/2017 0024   EOSABS 0.1 12/07/2017 0024   BASOSABS 0.0 12/07/2017 0024    Chest Imaging: CT of the neck 04/19/2018: 7 mm incidental pulmonary nodule of the right lung base, borderline enlarged subcarinal node.  Pulmonary Functions Testing Results: None   FeNO: None   Pathology: None   Echocardiogram:  01/10/2018 Study Conclusions  - Left ventricle: The cavity size was normal. Wall thickness was   normal. Doppler parameters are consistent with abnormal left   ventricular relaxation (grade 1 diastolic dysfunction). - Aortic valve: Mildly calcified annulus. There was mild stenosis.   Valve area (VTI): 1.24 cm^2. Valve area (Vmax): 1.18 cm^2. Valve   area (Vmean): 1.05 cm^2.  Heart Catheterization: None     Assessment & Plan:   Mediastinal adenopathy - Plan: CT Chest Wo Contrast  Nodule of lower lobe of right lung  Moderate persistent  asthma without complication  Unilateral vocal cord paralysis  Discussion:  This is an 82 year old female with unilateral vocal cord paralysis.  During the evaluation of this by her otolaryngologist a CT of the neck was completed which revealed an  enlarged subcarinal node as well as a 7 mm right lower lobe pulmonary nodule.  Patient has no history or symptoms concerning of a lymphoproliferative disorder such as lymphoma.  However a additional lymphadenopathy within the aortopulmonary window could cause compression of the recurrent laryngeal nerve.  Further evaluation of this should be completed with a noncontrast CT of the chest.  We will follow-up with the patient with additional recommendations pending results of her CT scan.  If there is lymphadenopathy within the mediastinum that is enlarged would consider bronchoscopy to include EBUS with transbronchial needle aspirations.  This was discussed with the patient.  Risk versus benefits versus alternatives were discussed with the patient and if needed she is agreeable to pursue bronchoscopy for evaluation of the lymphadenopathy but will make this decision once we have a dedicated CT of the chest.  In addition the CT of the chest will evaluate the right lower lobe pulmonary nodule and for any other additional nodules within the chest.  Greater than 50% of this patient's 45-minute visit was spent counseling on the above treatment and diagnostic plan.   Current Outpatient Medications:  .  allopurinol (ZYLOPRIM) 300 MG tablet, allopurinol 300 mg tablet, Disp: , Rfl:  .  amitriptyline (ELAVIL) 25 MG tablet, amitriptyline 25 mg tablet, Disp: , Rfl:  .  budesonide-formoterol (SYMBICORT) 160-4.5 MCG/ACT inhaler, Inhale 2 puffs into the lungs 2 (two) times daily., Disp: 10.2 g, Rfl: 5 .  famotidine (PEPCID) 10 MG tablet, Take 10 mg by mouth 2 (two) times daily., Disp: , Rfl:  .  FIBER PO, Take 1 tablet by mouth at bedtime., Disp: , Rfl:  .  flurazepam (DALMANE) 15 MG capsule, Take 15 mg by mouth 3 (three) times daily., Disp: , Rfl:  .  fluticasone (FLONASE) 50 MCG/ACT nasal spray, Place 2 sprays into both nostrils daily., Disp: 16 g, Rfl: 5 .  fluticasone (VERAMYST) 27.5 MCG/SPRAY nasal spray, Place  2 sprays into the nose daily., Disp: , Rfl:  .  HYDROcodone-acetaminophen (NORCO) 5-325 MG tablet, Take 1-2 tablets by mouth every 6 (six) hours as needed., Disp: 15 tablet, Rfl: 0 .  hydrOXYzine (ATARAX/VISTARIL) 25 MG tablet, Take 25 mg by mouth at bedtime., Disp: , Rfl:  .  levalbuterol (XOPENEX HFA) 45 MCG/ACT inhaler, Inhale 2 puffs into the lungs every 4 (four) hours as needed for wheezing., Disp: 1 Inhaler, Rfl: 0 .  levothyroxine (SYNTHROID, LEVOTHROID) 100 MCG tablet, Take 100 mcg by mouth daily before breakfast., Disp: , Rfl:  .  Linaclotide (LINZESS) 145 MCG CAPS capsule, Take 145 mcg by mouth at bedtime., Disp: , Rfl:  .  montelukast (SINGULAIR) 10 MG tablet, Take 10 mg by mouth at bedtime. Reported on 09/22/2015, Disp: , Rfl:  .  Multiple Vitamin (MULTIVITAMIN WITH MINERALS) TABS tablet, Take 1 tablet by mouth daily., Disp: , Rfl:  .  olmesartan (BENICAR) 20 MG tablet, Take 20 mg by mouth daily., Disp: , Rfl:  .  ondansetron (ZOFRAN ODT) 8 MG disintegrating tablet, 8mg  ODT q4 hours prn nausea, Disp: 10 tablet, Rfl: 0 .  pentosan polysulfate (ELMIRON) 100 MG capsule, Take 100 mg by mouth 3 (three) times daily., Disp: , Rfl:  .  simvastatin (ZOCOR) 40 MG tablet, Take 40 mg by mouth at  bedtime., Disp: , Rfl:  .  umeclidinium bromide (INCRUSE ELLIPTA) 62.5 MCG/INH AEPB, Inhale 1 puff into the lungs daily. (Patient not taking: Reported on 05/04/2018), Disp: 30 each, Rfl: Allen, DO [facility] 05/04/2018 2:36 PM

## 2018-05-04 ENCOUNTER — Encounter: Payer: Self-pay | Admitting: Pulmonary Disease

## 2018-05-04 ENCOUNTER — Ambulatory Visit (INDEPENDENT_AMBULATORY_CARE_PROVIDER_SITE_OTHER): Payer: Medicare Other | Admitting: Pulmonary Disease

## 2018-05-04 VITALS — BP 118/76 | HR 96 | Ht 61.0 in

## 2018-05-04 DIAGNOSIS — J4540 Moderate persistent asthma, uncomplicated: Secondary | ICD-10-CM | POA: Diagnosis not present

## 2018-05-04 DIAGNOSIS — R590 Localized enlarged lymph nodes: Secondary | ICD-10-CM

## 2018-05-04 DIAGNOSIS — R911 Solitary pulmonary nodule: Secondary | ICD-10-CM

## 2018-05-04 DIAGNOSIS — J3801 Paralysis of vocal cords and larynx, unilateral: Secondary | ICD-10-CM

## 2018-05-04 NOTE — Patient Instructions (Addendum)
Thank you for visiting Dr. Valeta Harms at [facility]. Today we recommend the following: Orders Placed This Encounter  Procedures  . CT Chest Wo Contrast   Return if symptoms worsen or fail to improve, pending CT results .

## 2018-05-15 ENCOUNTER — Ambulatory Visit (INDEPENDENT_AMBULATORY_CARE_PROVIDER_SITE_OTHER)
Admission: RE | Admit: 2018-05-15 | Discharge: 2018-05-15 | Disposition: A | Discharge status: Home / Self Care | Payer: Medicare Other | Source: Ambulatory Visit | Attending: Pulmonary Disease | Admitting: Pulmonary Disease

## 2018-05-15 DIAGNOSIS — J986 Disorders of diaphragm: Secondary | ICD-10-CM | POA: Diagnosis not present

## 2018-05-15 DIAGNOSIS — R590 Localized enlarged lymph nodes: Secondary | ICD-10-CM

## 2018-05-15 DIAGNOSIS — R911 Solitary pulmonary nodule: Secondary | ICD-10-CM | POA: Diagnosis not present

## 2018-05-19 ENCOUNTER — Telehealth: Payer: Self-pay | Admitting: Pulmonary Disease

## 2018-05-19 DIAGNOSIS — R911 Solitary pulmonary nodule: Secondary | ICD-10-CM

## 2018-05-19 NOTE — Telephone Encounter (Signed)
Called and spoke to patient, made aware of CT results. Patient would like to speak with BI in more detail and would like to hear from him that she is cleared for surgery for her vocal cords. Made patient aware BI would not be available until next week. Voiced understanding.   BI please advise. Patient would like a call from you.

## 2018-05-22 ENCOUNTER — Ambulatory Visit: Payer: Medicare Other | Admitting: Obstetrics & Gynecology

## 2018-05-22 NOTE — Telephone Encounter (Signed)
PCCM:  I called and spoke with the patient. She understands and all questions answered.   Please schedule for a follow up non-contrasted CT chest in 6 months to evaluate the 31mm RML nodule.   Also, from a pulmonary standpoint she should be appropriate for VC surgery by ENT.  Garner Nash, DO [facility] 05/22/2018 4:20 PM

## 2018-05-22 NOTE — Telephone Encounter (Signed)
Patient calling stating she was told there was some elevation of the diaphragm on the CT and she wants to know if that will interfere with vocal chord surgery and is there something she needs to do to breathe better.  Also wants to know if Dr. Valeta Harms will inform physician at [facility] also, as she would like for him to. CB is [phone] or cell [phone].

## 2018-05-22 NOTE — Telephone Encounter (Signed)
Orders have been placed.

## 2018-05-23 ENCOUNTER — Telehealth: Payer: Self-pay | Admitting: Pulmonary Disease

## 2018-05-23 DIAGNOSIS — H608X3 Other otitis externa, bilateral: Secondary | ICD-10-CM | POA: Diagnosis not present

## 2018-05-23 DIAGNOSIS — H6123 Impacted cerumen, bilateral: Secondary | ICD-10-CM | POA: Diagnosis not present

## 2018-05-23 NOTE — Telephone Encounter (Signed)
Letter of surgical clearance faxed. Nothing further is needed at this time.

## 2018-05-23 NOTE — Telephone Encounter (Signed)
Called and spoke to [place] at [facility], she is needing paper work stating that patient is cleared for surgery. Patient is having a Laryngoscopy with botox injection. Informed patient I would message BI clearance, as I don't see anything in the AVS addressing this.   BI please advise if patient is cleared for vocal cord surgery. If so note will need to be updated  to reflect this. Thanks.

## 2018-05-25 ENCOUNTER — Encounter: Payer: Self-pay | Admitting: Physical Therapy

## 2018-05-25 NOTE — Therapy (Signed)
[facility] [address] [place], [place], [address] Phone: [phone]   Fax:  [phone]  Patient Details  Name: NATOYA VISCOMI MRN: [id number] Date of Birth: 01/06/1936 Referring Provider:  No ref. provider found  Encounter Date: 05/25/2018  PHYSICAL THERAPY DISCHARGE SUMMARY  Visits from Start of Care: 4  Current functional level related to goals / functional outcomes: Unable to assess; did not return due to change in medical status   Remaining deficits: Dizziness, impaired balance   Education / Equipment: HEP  Plan: Patient agrees to discharge.  Patient goals were not met. Patient is being discharged due to a change in medical status.  ?????     Rico Junker, PT, DPT 05/25/18    9:35 AM    [facility] [address] [place], [place], [address] Phone: [phone]   Fax:  [phone]

## 2018-05-26 DIAGNOSIS — L900 Lichen sclerosus et atrophicus: Secondary | ICD-10-CM

## 2018-05-26 HISTORY — DX: Lichen sclerosus et atrophicus: L90.0

## 2018-06-01 DIAGNOSIS — R490 Dysphonia: Secondary | ICD-10-CM | POA: Diagnosis not present

## 2018-06-01 DIAGNOSIS — J3801 Paralysis of vocal cords and larynx, unilateral: Secondary | ICD-10-CM | POA: Diagnosis not present

## 2018-06-01 DIAGNOSIS — J383 Other diseases of vocal cords: Secondary | ICD-10-CM | POA: Diagnosis not present

## 2018-06-01 DIAGNOSIS — J387 Other diseases of larynx: Secondary | ICD-10-CM | POA: Diagnosis not present

## 2018-06-02 ENCOUNTER — Encounter: Payer: Self-pay | Admitting: Gynecology

## 2018-06-02 ENCOUNTER — Ambulatory Visit (INDEPENDENT_AMBULATORY_CARE_PROVIDER_SITE_OTHER): Payer: Medicare Other | Admitting: Gynecology

## 2018-06-02 VITALS — BP 136/80 | Ht 61.0 in | Wt 160.0 lb

## 2018-06-02 DIAGNOSIS — N763 Subacute and chronic vulvitis: Secondary | ICD-10-CM | POA: Diagnosis not present

## 2018-06-02 DIAGNOSIS — N952 Postmenopausal atrophic vaginitis: Secondary | ICD-10-CM

## 2018-06-02 DIAGNOSIS — N8111 Cystocele, midline: Secondary | ICD-10-CM | POA: Diagnosis not present

## 2018-06-02 DIAGNOSIS — R32 Unspecified urinary incontinence: Secondary | ICD-10-CM | POA: Diagnosis not present

## 2018-06-02 DIAGNOSIS — L900 Lichen sclerosus et atrophicus: Secondary | ICD-10-CM

## 2018-06-02 NOTE — Progress Notes (Signed)
    Carla [name] 18-Feb-1936 [id number]        82 y.o.  G1P1 new patient presents complaining of several issues.  She has a history of prolapsed bladder with attempted pessary trial but unsuccessful due to discomfort.  Has chronic irritation that she attributes to chronic urinary incontinence.  Has been using Mycolog type cream on and off to help with the discomfort.  Her incontinence is a dribbling type incontinence with little provocation.  She will notice just getting up and walking across the room she will lose urine.  Wears pads daily which contributes to the irritation.  Past medical history,surgical history, problem list, medications, allergies, family history and social history were all reviewed and documented in the EPIC chart.  Directed ROS with pertinent positives and negatives documented in the history of present illness/assessment and plan.  Exam: Caryn Bee assistant Vitals:   06/02/18 1132  BP: 136/80  Weight: 160 lb (72.6 kg)  Height: 5\' 1"  (1.549 m)   General appearance:  Normal Abdomen soft nontender without masses guarding rebound Pelvic external BUS vagina with atrophic changes.  Blanching of the perivaginal, perineal body and perianal skin suspicious for lichen sclerosus.  First-degree approaching second-degree cystocele with straining.  No significant rectocele.  No significant uterine prolapse.  Cervix atrophic.  Uterus normal size midline mobile nontender.  Adnexa without masses or tenderness.  Rectal exam normal.  Physical Exam  Genitourinary:      Procedure: The skin at the biopsy site was cleansed with Betadine, infiltrated with 1% lidocaine and a small biopsy was taken.  Silver nitrate hemostasis applied afterwards.  Post biopsy instructions were reviewed.  Assessment/Plan:  82 y.o. G1P1 with chronic vulvar irritation that is the patient's most significant complaint.  Also has incontinence in a dribbling fashion just with walking.  No urgency symptoms.   No real stress as far as loss of large volumes with coughing laughing sneezing.  Exam shows atrophic changes with mild cystocele.  Questionable lichen sclerosis on perivaginal/perianal skin.  Representative biopsy taken as above.  As her main complaint is the vulvar irritation do not feel pessary needed at this time as her cystocele does not seem to be a major issue from a bulging or protruding standpoint.  I discussed the addition of vaginal estrogen cream to help strengthen the vaginal tissues and possibly help with her incontinence.  We discussed referral to urology but at this point the patient prefers just to wait to see how she does with the Premarin cream and then decide from there.  We discussed the risks of vaginal estrogen cream to include absorption with systemic effects such as thrombosis and breast stimulation.  She actually has a supply of Premarin that she apparently was given a while ago but has not been using and she is going to start with 1 to 2 g intravaginal nightly for 2 weeks then twice weekly.  We discussed not to expect a significant change for a month or 2.  The patient will follow-up for her vulvar biopsy results.  If suggesting lichen sclerosus will treat with Temovate 0.05% cream.  Patient will follow-up via phone next week for the biopsy results and formulation of a long-term plan.    Anastasio Auerbach MD, 2:00 PM 06/02/2018

## 2018-06-02 NOTE — Patient Instructions (Addendum)
Office will call you with biopsy results.  Start on the Premarin vaginal cream nightly for 14 days then twice weekly.

## 2018-06-06 ENCOUNTER — Encounter: Payer: Self-pay | Admitting: Gynecology

## 2018-06-06 ENCOUNTER — Other Ambulatory Visit: Payer: Self-pay | Admitting: Gynecology

## 2018-06-06 LAB — TISSUE SPECIMEN

## 2018-06-06 LAB — PATHOLOGY

## 2018-06-06 MED ORDER — CLOBETASOL PROPIONATE 0.05 % EX CREA: TOPICAL_CREAM | CUTANEOUS | 0 refills | Status: DC

## 2018-06-08 ENCOUNTER — Telehealth: Payer: Self-pay | Admitting: *Deleted

## 2018-06-08 NOTE — Telephone Encounter (Signed)
Clobetasol propionate cream approved until 06/08/2019, pharmacy informed.

## 2018-06-26 DIAGNOSIS — R490 Dysphonia: Secondary | ICD-10-CM | POA: Diagnosis not present

## 2018-06-26 DIAGNOSIS — J3801 Paralysis of vocal cords and larynx, unilateral: Secondary | ICD-10-CM | POA: Diagnosis not present

## 2018-06-26 DIAGNOSIS — Z87891 Personal history of nicotine dependence: Secondary | ICD-10-CM | POA: Diagnosis not present

## 2018-06-26 DIAGNOSIS — J383 Other diseases of vocal cords: Secondary | ICD-10-CM | POA: Diagnosis not present

## 2018-07-04 DIAGNOSIS — J069 Acute upper respiratory infection, unspecified: Secondary | ICD-10-CM | POA: Diagnosis not present

## 2018-07-04 DIAGNOSIS — R05 Cough: Secondary | ICD-10-CM | POA: Diagnosis not present

## 2018-07-04 DIAGNOSIS — J3800 Paralysis of vocal cords and larynx, unspecified: Secondary | ICD-10-CM | POA: Diagnosis not present

## 2018-07-04 DIAGNOSIS — Z6831 Body mass index (BMI) 31.0-31.9, adult: Secondary | ICD-10-CM | POA: Diagnosis not present

## 2018-07-27 DIAGNOSIS — J3801 Paralysis of vocal cords and larynx, unilateral: Secondary | ICD-10-CM | POA: Diagnosis not present

## 2018-07-27 DIAGNOSIS — J383 Other diseases of vocal cords: Secondary | ICD-10-CM | POA: Diagnosis not present

## 2018-07-27 DIAGNOSIS — R490 Dysphonia: Secondary | ICD-10-CM | POA: Diagnosis not present

## 2018-08-11 DIAGNOSIS — Z6831 Body mass index (BMI) 31.0-31.9, adult: Secondary | ICD-10-CM | POA: Diagnosis not present

## 2018-08-11 DIAGNOSIS — J45998 Other asthma: Secondary | ICD-10-CM | POA: Diagnosis not present

## 2018-08-11 DIAGNOSIS — E668 Other obesity: Secondary | ICD-10-CM | POA: Diagnosis not present

## 2018-08-11 DIAGNOSIS — J3800 Paralysis of vocal cords and larynx, unspecified: Secondary | ICD-10-CM | POA: Diagnosis not present

## 2018-08-11 DIAGNOSIS — N183 Chronic kidney disease, stage 3 (moderate): Secondary | ICD-10-CM | POA: Diagnosis not present

## 2018-08-11 DIAGNOSIS — J3089 Other allergic rhinitis: Secondary | ICD-10-CM | POA: Diagnosis not present

## 2018-08-11 DIAGNOSIS — E038 Other specified hypothyroidism: Secondary | ICD-10-CM | POA: Diagnosis not present

## 2018-08-11 DIAGNOSIS — I10 Essential (primary) hypertension: Secondary | ICD-10-CM | POA: Diagnosis not present

## 2018-08-11 DIAGNOSIS — M419 Scoliosis, unspecified: Secondary | ICD-10-CM | POA: Diagnosis not present

## 2018-08-28 DIAGNOSIS — R490 Dysphonia: Secondary | ICD-10-CM | POA: Diagnosis not present

## 2018-08-28 DIAGNOSIS — J383 Other diseases of vocal cords: Secondary | ICD-10-CM | POA: Diagnosis not present

## 2018-09-01 DIAGNOSIS — M5137 Other intervertebral disc degeneration, lumbosacral region: Secondary | ICD-10-CM | POA: Diagnosis not present

## 2018-09-21 DIAGNOSIS — H2512 Age-related nuclear cataract, left eye: Secondary | ICD-10-CM | POA: Diagnosis not present

## 2018-09-21 DIAGNOSIS — T1512XA Foreign body in conjunctival sac, left eye, initial encounter: Secondary | ICD-10-CM | POA: Diagnosis not present

## 2018-09-21 DIAGNOSIS — Z961 Presence of intraocular lens: Secondary | ICD-10-CM | POA: Diagnosis not present

## 2018-09-28 DIAGNOSIS — M25511 Pain in right shoulder: Secondary | ICD-10-CM | POA: Diagnosis not present

## 2018-09-28 DIAGNOSIS — M545 Low back pain: Secondary | ICD-10-CM | POA: Diagnosis not present

## 2018-09-28 DIAGNOSIS — M5136 Other intervertebral disc degeneration, lumbar region: Secondary | ICD-10-CM | POA: Diagnosis not present

## 2018-12-06 IMAGING — CT CT L SPINE W/O CM
3 of 4 series · 13 of 33 positions shown, 16 images · non-contrast
Comparison: 12/08/2010 lumbar spine radiographs

CLINICAL DATA: 81 y/o F; sudden increased back pain after
chiropractor appointment a radiating down the right leg.

EXAM:
CT LUMBAR SPINE WITHOUT CONTRAST
TECHNIQUE: Multidetector CT imaging of the lumbar spine was performed without
intravenous contrast administration. Multiplanar CT image
reconstructions were also generated.

[Series 4: l spine st · axial · 0.30mm/px · z∈[+1378,+1530]mm · 5 of 114 slices shown, 7 images]
[im 19/114  soft-tissue]
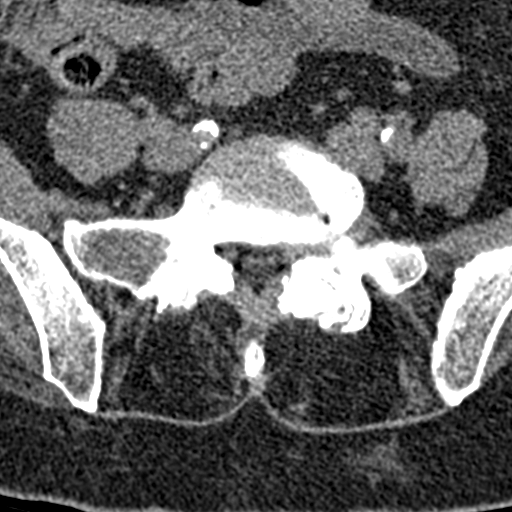
[im 19/114  bone]
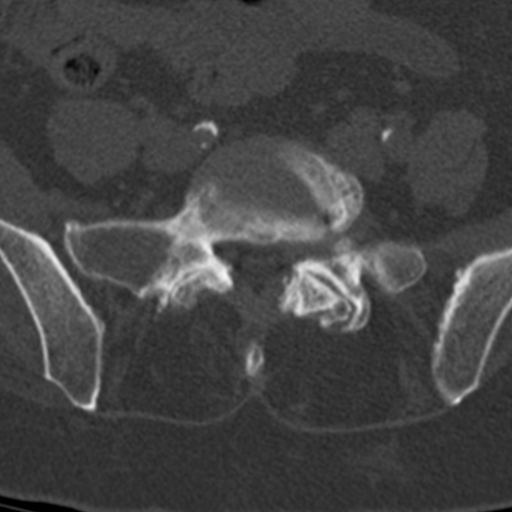
[im 38/114  bone]
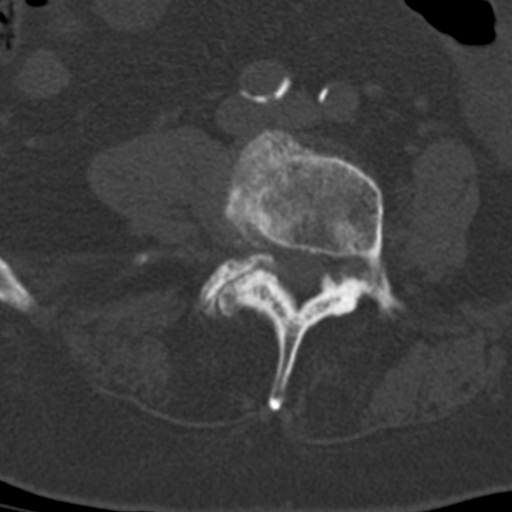
[im 57/114  bone]
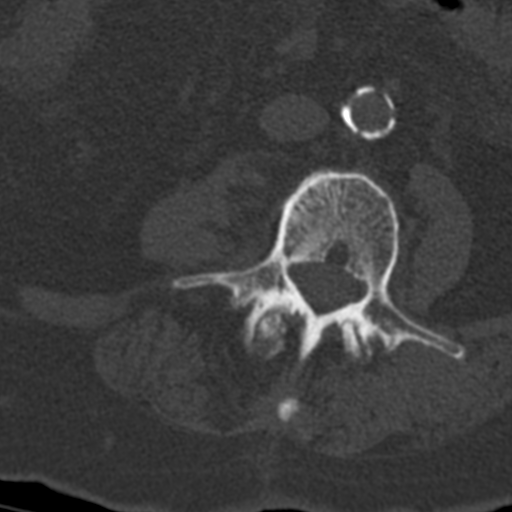
[im 76/114  bone]
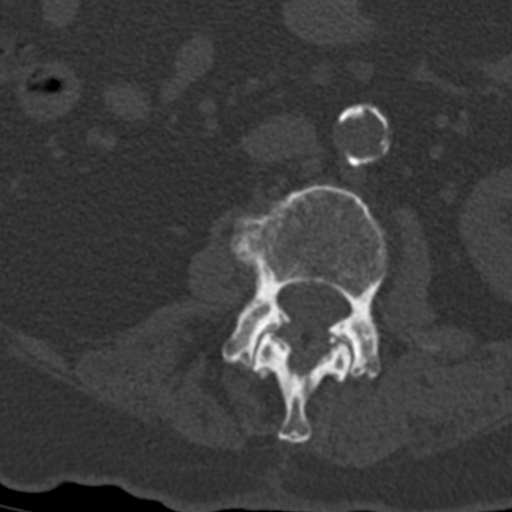
[im 95/114  soft-tissue]
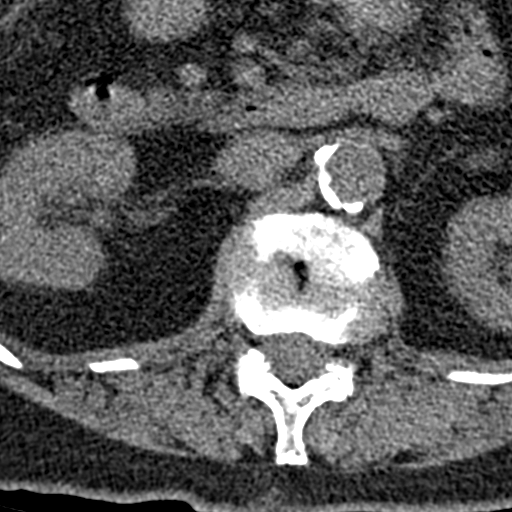
[im 95/114  bone]
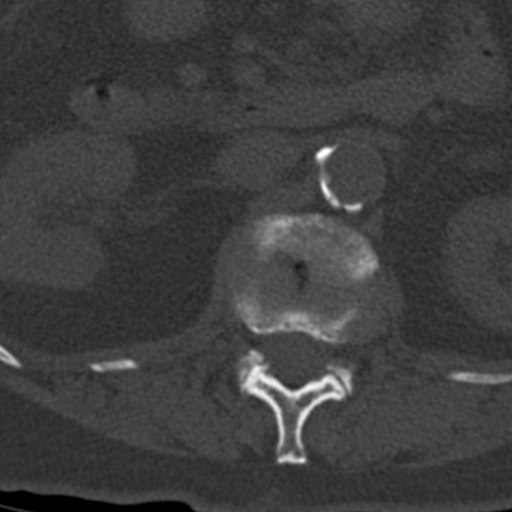

[Series 8: sagittal st · sagittal · 0.33mm/px · 5 of 70 slices shown, 6 images]
[im 24/70  bone]
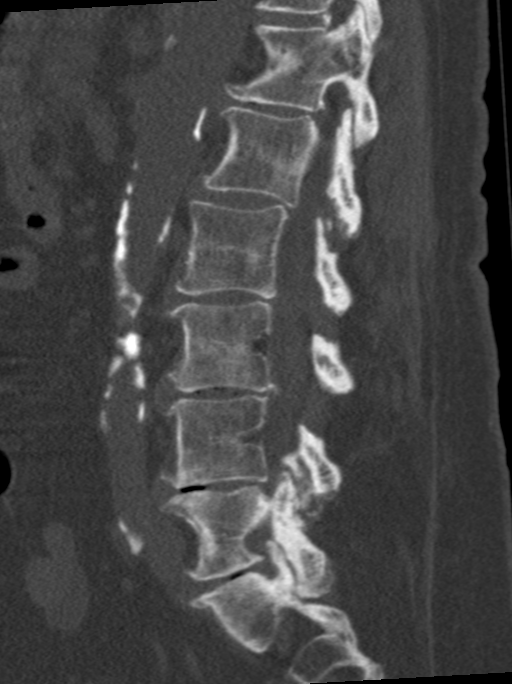
[im 29/70  bone]
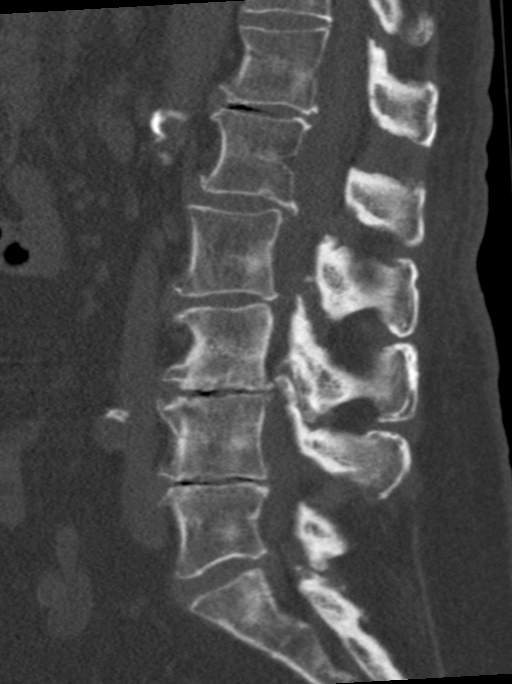
[im 35/70  soft-tissue]
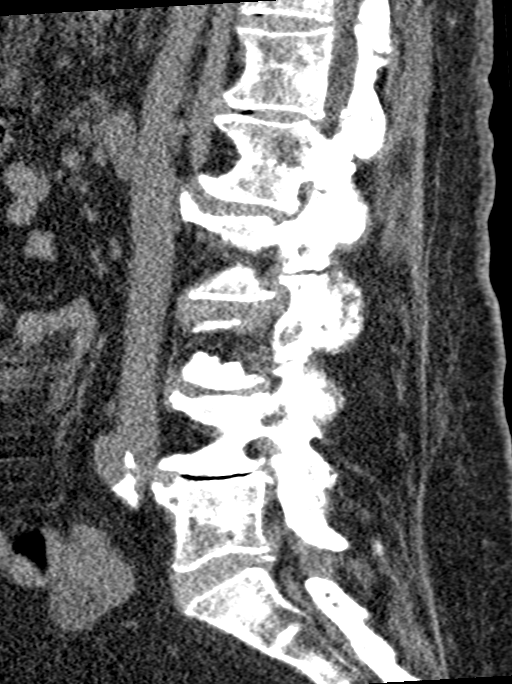
[im 35/70  bone]
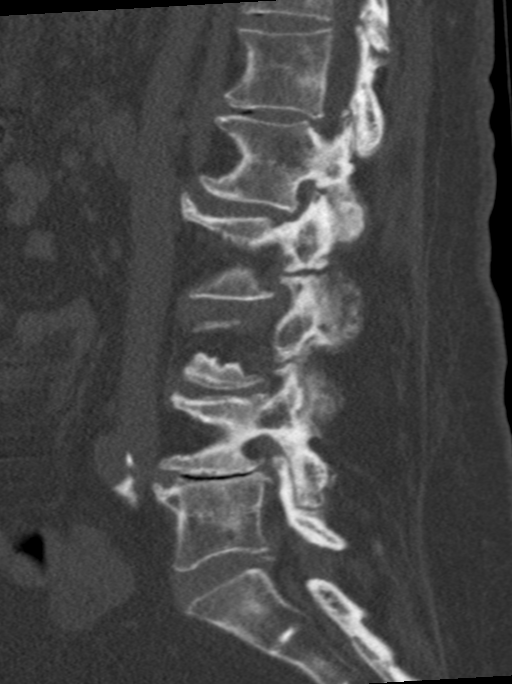
[im 41/70  bone]
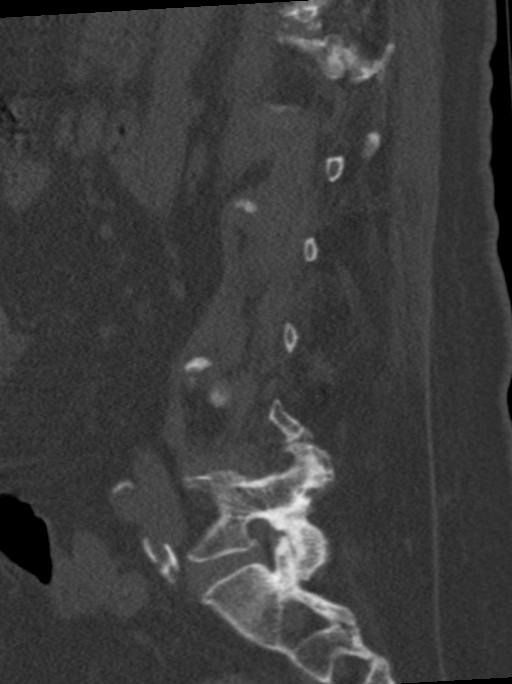
[im 47/70  bone]
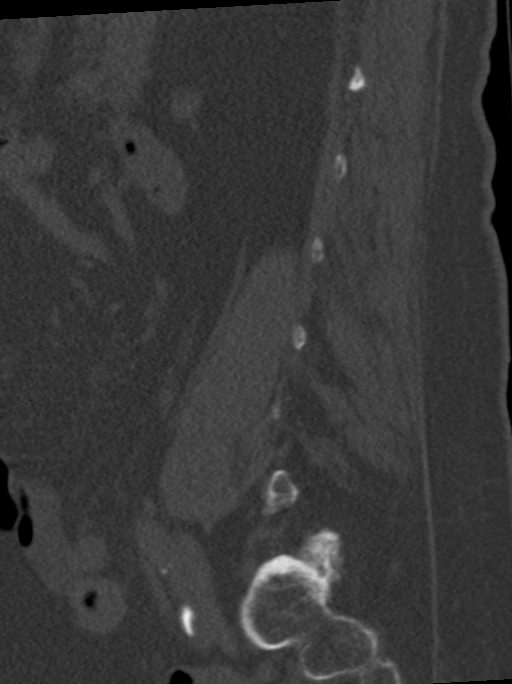

[Series 12: coronal st · coronal · 0.33mm/px · 3 of 68 slices shown]
[im 14/68  bone]
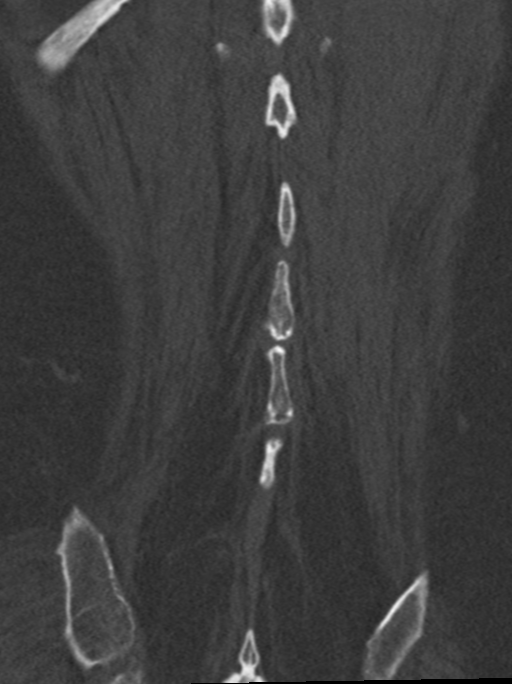
[im 27/68  bone]
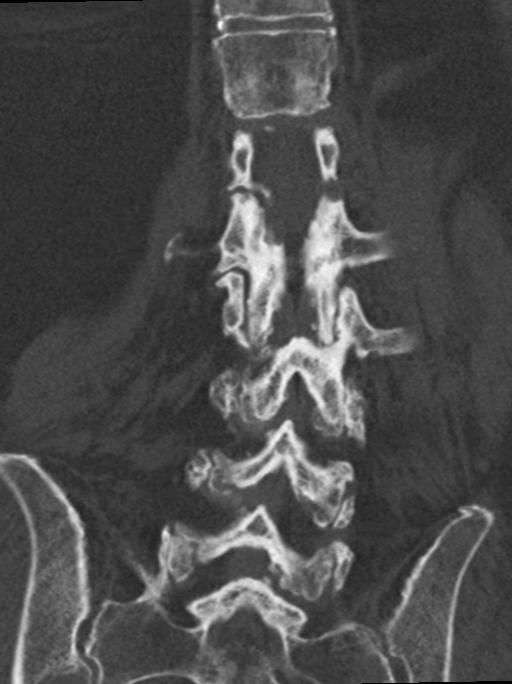
[im 41/68  bone]
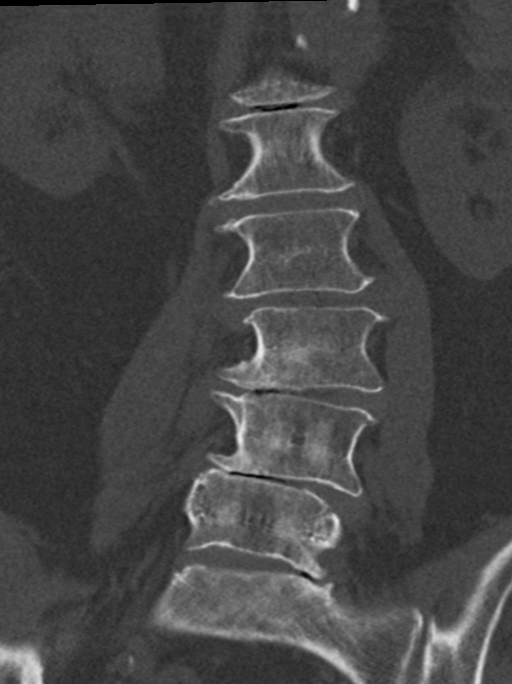

[13 of 33 positions shown; findings below may reference images not displayed]

FINDINGS: Segmentation: 5 lumbar type vertebrae.

Alignment: Stable mild lumbar levocurvature with apex at L3.
straightening of lumbar lordosis without listhesis.

Vertebrae: No acute fracture or focal pathologic process. Multilevel
mild and moderate intervertebral disc space narrowing and severe
disc space narrowing at the L3 through L5 levels. Severe multilevel
hypertrophic facet arthropathy.

Paraspinal and other soft tissues: Abdominal aortic calcific
atherosclerosis.

Disc levels: Multilevel discogenic degenerative changes with disc
bulges and endplate marginal osteophytes combined with advanced
facet and ligamentum flavum hypertrophy. Multifactorial canal
stenosis canal stenosis greatest at the L3-4 level where it is
moderate to severe. Multilevel mild and moderate foraminal stenosis
greatest on the left side at the L4-5 and L5-S1 levels and right
side at L2-3.
IMPRESSION: 1. No acute fracture or dislocation identified.
2. Stable mild lumbar levocurvature with apex at L3.
3. Stable advanced disc and facet degenerative changes greatest at
the L3-4 and L4-5 levels given differences in technique.
4. Multifactorial moderate to severe canal stenosis at L3-4.
5. Multiple levels of mild and moderate bony foraminal stenosis
bilaterally.

By: Maire Eskeli M.D.

## 2018-12-19 DIAGNOSIS — H6123 Impacted cerumen, bilateral: Secondary | ICD-10-CM | POA: Diagnosis not present

## 2019-01-19 ENCOUNTER — Ambulatory Visit (INDEPENDENT_AMBULATORY_CARE_PROVIDER_SITE_OTHER)
Admission: RE | Admit: 2019-01-19 | Discharge: 2019-01-19 | Disposition: A | Discharge status: Home / Self Care | Payer: Medicare Other | Source: Ambulatory Visit | Attending: Pulmonary Disease | Admitting: Pulmonary Disease

## 2019-01-19 ENCOUNTER — Other Ambulatory Visit: Payer: Self-pay

## 2019-01-19 DIAGNOSIS — R918 Other nonspecific abnormal finding of lung field: Secondary | ICD-10-CM | POA: Diagnosis not present

## 2019-01-19 DIAGNOSIS — R911 Solitary pulmonary nodule: Secondary | ICD-10-CM | POA: Diagnosis not present

## 2019-02-06 DIAGNOSIS — J3801 Paralysis of vocal cords and larynx, unilateral: Secondary | ICD-10-CM | POA: Diagnosis not present

## 2019-02-06 DIAGNOSIS — R490 Dysphonia: Secondary | ICD-10-CM | POA: Diagnosis not present

## 2019-02-06 DIAGNOSIS — J383 Other diseases of vocal cords: Secondary | ICD-10-CM | POA: Diagnosis not present

## 2019-02-07 ENCOUNTER — Encounter: Payer: Self-pay | Admitting: Acute Care

## 2019-02-07 ENCOUNTER — Ambulatory Visit (INDEPENDENT_AMBULATORY_CARE_PROVIDER_SITE_OTHER): Payer: Medicare Other | Admitting: Acute Care

## 2019-02-07 ENCOUNTER — Other Ambulatory Visit: Payer: Self-pay

## 2019-02-07 DIAGNOSIS — R911 Solitary pulmonary nodule: Secondary | ICD-10-CM

## 2019-02-07 DIAGNOSIS — R918 Other nonspecific abnormal finding of lung field: Secondary | ICD-10-CM

## 2019-02-07 DIAGNOSIS — Z87891 Personal history of nicotine dependence: Secondary | ICD-10-CM

## 2019-02-07 NOTE — Progress Notes (Signed)
Virtual Visit via Telephone Note  I connected with Carla [name] on 02/07/19 at  2:00 PM EDT by telephone and verified that I am speaking with the correct person using two identifiers.  I  confirmed date of birth and address to authenticate patient  Identity. My nurse Quentin Ore reviewed medications and ordered any refills required.  Location: Patient: At home Provider: In the office at [facility], [place] , [address]   I discussed the limitations, risks, security and privacy concerns of performing an evaluation and management service by telephone and the availability of in person appointments. I also discussed with the patient that there may be a patient responsible charge related to this service. The patient expressed understanding and agreed to proceed.  Pt. Is an 83 year old female, former smoker , quit 1986 . She is followed by Dr. Valeta Harms.  History of Present Illness: Pt. Presents for a Follow up Tele visit for interval development of 6 mm pulmonary nodule per CT chest done 01/19/2019. Marland Kitchen She needs a 3 month follow up scan based on radiology recommendations.. We are calling today to discuss that she needs a follow up scan in 3 months, and to ensure she wants to continue getting scanned based on her age and whether or not she would treat lung cancer if she were to be diagnosed. Pt. States she has been doing well. She is compliant with her Symbicort daily. She states she does want to continue following her nodules as she has a very active lifestyle and she wants to be aggressive in her care. We will place an order for follow up CT scan in 6 months. I have advised the patient to let us know if she needs [place] sooner. She denies fever, chest pain, orthopnea or hemoptysis..    Observations/Objective: CT Chest 6/26/202 Lungs/Pleura: There is a 6 mm pulmonary nodule in the medial right upper lobe (axial series 3, image 25). This appears to be new since prior study there is  mild bronchiectasis with tree-in-bud airspace opacities within the right middle lobe. There are calcified granulomas in the right lower lobe. The previously noted pulmonary nodule in the right middle lobe has resolved. There is new presumed atelectasis in the medial superior segment of the right lower lobe posterior to bronchus intermedius. Again noted is elevation of the right hemidiaphragm with adjacent compressive atelectasis. Interval resolution of the previously noted 5 mm pulmonary nodule in the right middle lobe. Impression Interval development of a 6 mm pulmonary nodule in the right upper lobe as detailed above. Non-contrast chest CT at 6-12 months is recommended. If the nodule is stable at time of repeat CT, then future CT at 18-24 months (from today's scan) is considered optional for low-risk patients, but is recommended for high-risk patients. This recommendation follows the consensus statement: Guidelines for Management of Incidental Pulmonary Nodules Detected on CT Images: From the Fleischner Society 2017; Radiology 2017; 284:228-243. New airspace opacity in the medial aspect of the superior segment of the right lower lobe, favored to represent atelectasis. Attention on follow-up examinations is recommended. Aortic Atherosclerosis (ICD10-I70.0).  Assessment and Plan: 6 month follow up CT chest per recommendations We will call to schedule closer to the time  Continue Symbicort twice daily as you have been doing Rinse mouth after use  Follow up with Dr. Valeta Harms after CT in 6 months or sooner as needed.  Follow Up Instructions: Follow up CT Chest in 6  months.   I discussed  the assessment and treatment plan with the patient. The patient was provided an opportunity to ask questions and all were answered. The patient agreed with the plan and demonstrated an understanding of the instructions.   The patient was advised to call back or seek an in-person evaluation if the  symptoms worsen or if the condition fails to improve as anticipated.  I provided 23 minutes of non-face-to-face time during this encounter.   Magdalen Spatz, NP 02/07/2019 2:06 PM

## 2019-02-07 NOTE — Patient Instructions (Addendum)
It was good to speak with you today. We will order a 6 month follow up CT chest per recommendations to follow for resolution of the 6 mm pulmonary nodule . We will call to schedule closer to the time  We will call you with the results Continue Symbicort twice daily as you have been doing Rinse mouth after use  Follow up with Dr. Valeta Harms after CT in 6 months or sooner as needed. Remain active as best you can in the current quarantine climate Call us sooner if you need [place] sooner.

## 2019-02-16 NOTE — Progress Notes (Signed)
PCCM: Thanks for seeing her and ordering followup imaging. [facility] 02/16/2019 5:15 PM

## 2019-04-04 DIAGNOSIS — E7849 Other hyperlipidemia: Secondary | ICD-10-CM | POA: Diagnosis not present

## 2019-04-04 DIAGNOSIS — M859 Disorder of bone density and structure, unspecified: Secondary | ICD-10-CM | POA: Diagnosis not present

## 2019-04-04 DIAGNOSIS — E038 Other specified hypothyroidism: Secondary | ICD-10-CM | POA: Diagnosis not present

## 2019-04-04 DIAGNOSIS — M109 Gout, unspecified: Secondary | ICD-10-CM | POA: Diagnosis not present

## 2019-04-06 DIAGNOSIS — R82998 Other abnormal findings in urine: Secondary | ICD-10-CM | POA: Diagnosis not present

## 2019-04-06 DIAGNOSIS — I10 Essential (primary) hypertension: Secondary | ICD-10-CM | POA: Diagnosis not present

## 2019-04-06 DIAGNOSIS — Z23 Encounter for immunization: Secondary | ICD-10-CM | POA: Diagnosis not present

## 2019-04-09 ENCOUNTER — Other Ambulatory Visit: Payer: Self-pay | Admitting: Allergy and Immunology

## 2019-04-10 DIAGNOSIS — Z1331 Encounter for screening for depression: Secondary | ICD-10-CM | POA: Diagnosis not present

## 2019-04-10 DIAGNOSIS — Z0000 Encounter for general adult medical examination without abnormal findings: Secondary | ICD-10-CM | POA: Diagnosis not present

## 2019-04-10 DIAGNOSIS — K219 Gastro-esophageal reflux disease without esophagitis: Secondary | ICD-10-CM | POA: Diagnosis not present

## 2019-04-10 DIAGNOSIS — K589 Irritable bowel syndrome without diarrhea: Secondary | ICD-10-CM | POA: Diagnosis not present

## 2019-04-10 DIAGNOSIS — E039 Hypothyroidism, unspecified: Secondary | ICD-10-CM | POA: Diagnosis not present

## 2019-04-10 DIAGNOSIS — J45909 Unspecified asthma, uncomplicated: Secondary | ICD-10-CM | POA: Diagnosis not present

## 2019-04-10 DIAGNOSIS — E785 Hyperlipidemia, unspecified: Secondary | ICD-10-CM | POA: Diagnosis not present

## 2019-04-10 DIAGNOSIS — M8580 Other specified disorders of bone density and structure, unspecified site: Secondary | ICD-10-CM | POA: Diagnosis not present

## 2019-04-10 DIAGNOSIS — I129 Hypertensive chronic kidney disease with stage 1 through stage 4 chronic kidney disease, or unspecified chronic kidney disease: Secondary | ICD-10-CM | POA: Diagnosis not present

## 2019-04-10 DIAGNOSIS — E669 Obesity, unspecified: Secondary | ICD-10-CM | POA: Diagnosis not present

## 2019-04-10 DIAGNOSIS — N183 Chronic kidney disease, stage 3 (moderate): Secondary | ICD-10-CM | POA: Diagnosis not present

## 2019-04-10 DIAGNOSIS — J309 Allergic rhinitis, unspecified: Secondary | ICD-10-CM | POA: Diagnosis not present

## 2019-04-10 DIAGNOSIS — N3010 Interstitial cystitis (chronic) without hematuria: Secondary | ICD-10-CM | POA: Diagnosis not present

## 2019-04-11 ENCOUNTER — Other Ambulatory Visit: Payer: Self-pay | Admitting: Allergy and Immunology

## 2019-04-12 ENCOUNTER — Other Ambulatory Visit: Payer: Self-pay | Admitting: Gynecology

## 2019-04-25 DIAGNOSIS — M545 Low back pain: Secondary | ICD-10-CM | POA: Diagnosis not present

## 2019-05-01 DIAGNOSIS — M545 Low back pain: Secondary | ICD-10-CM | POA: Diagnosis not present

## 2019-05-03 ENCOUNTER — Encounter: Payer: Self-pay | Admitting: Gynecology

## 2019-05-09 DIAGNOSIS — M545 Low back pain: Secondary | ICD-10-CM | POA: Diagnosis not present

## 2019-05-14 IMAGING — CT CT CHEST W/O CM
2 of 3 series · 15 of 36 positions shown, 18 images · non-contrast
Comparison: Chest x-ray 07/26/2016

CLINICAL DATA: Mediastinal adenopathy. Elevated right
hemidiaphragm. Focal paralysis.

EXAM:
CT CHEST WITHOUT CONTRAST
TECHNIQUE: Multidetector CT imaging of the chest was performed following the
standard protocol without IV contrast.

[Series 2: thorax · axial · 0.71mm/px · z∈[-277,-45]mm · 12 of 138 slices shown, 15 images]
[im 11/138  mediastinal]
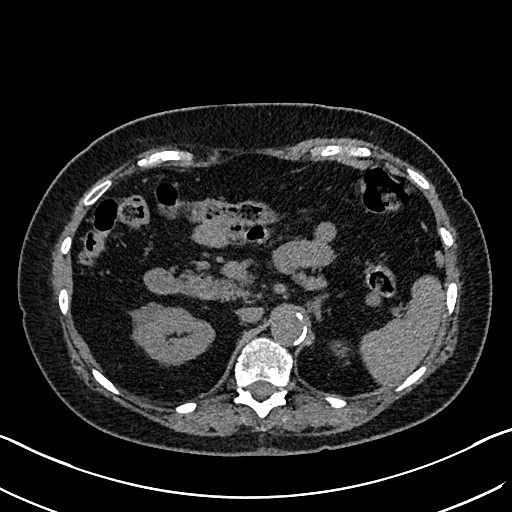
[im 11/138  lung]
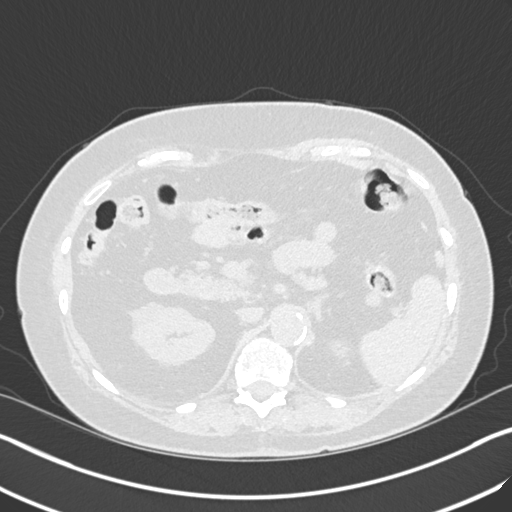
[im 21/138  lung]
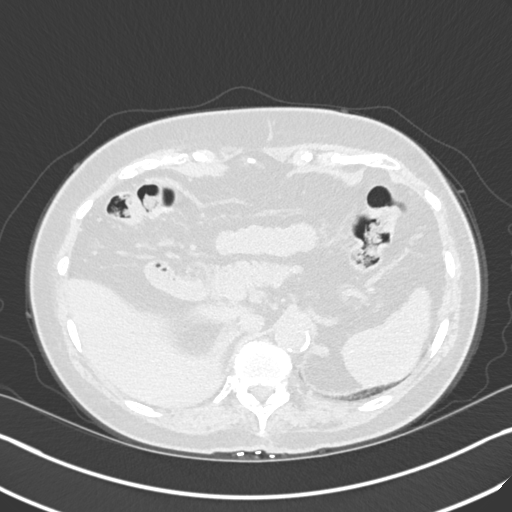
[im 31/138  lung]
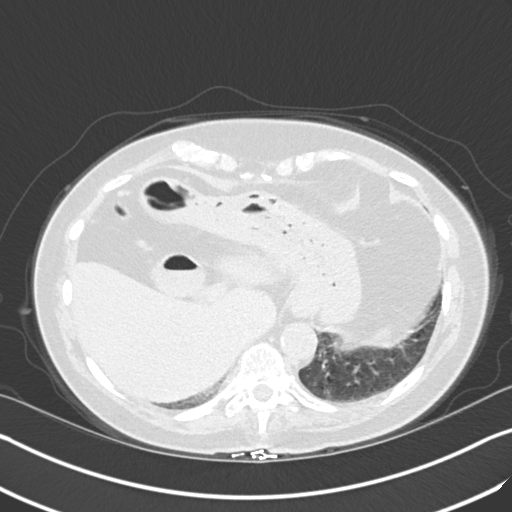
[im 41/138  lung]
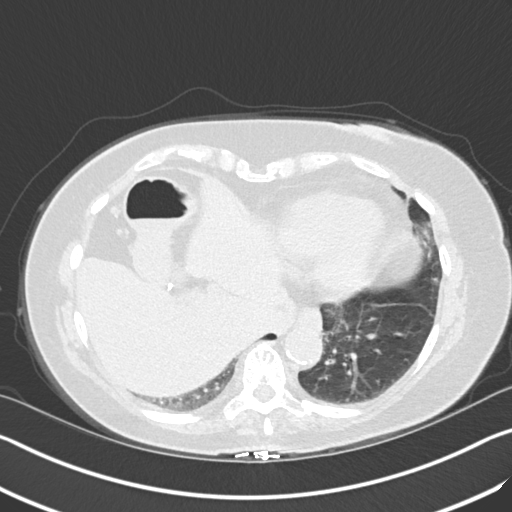
[im 51/138  mediastinal]
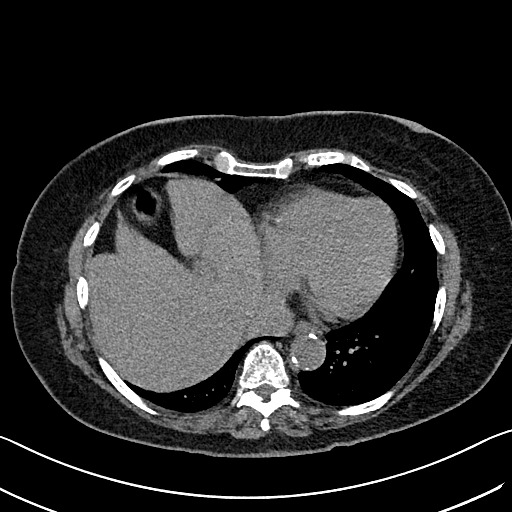
[im 51/138  lung]
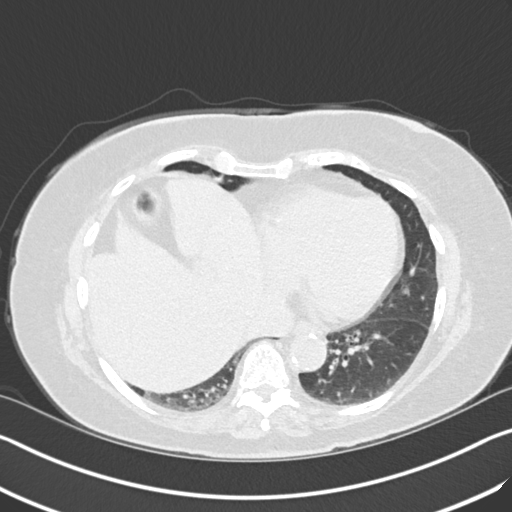
[im 61/138  lung]
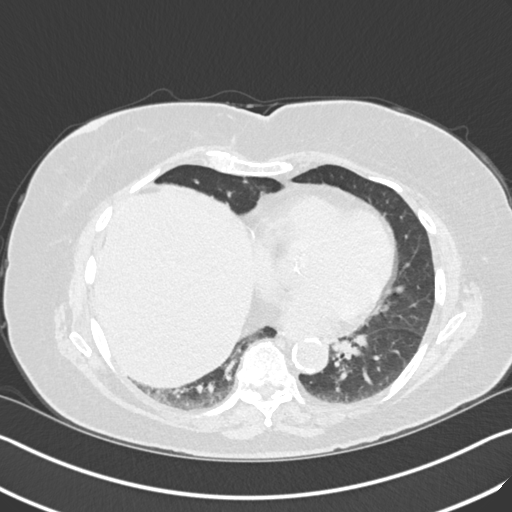
[im 77/138  lung]
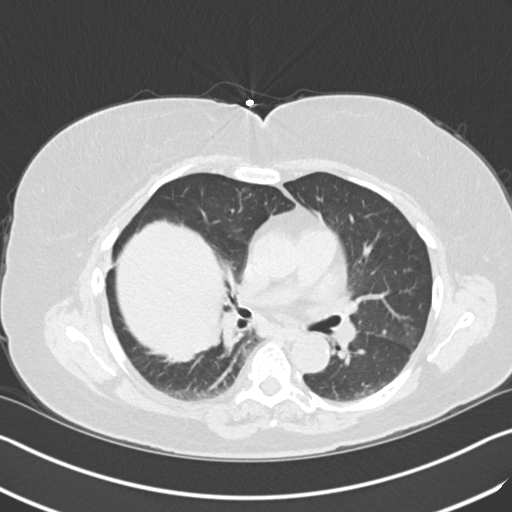
[im 87/138  lung]
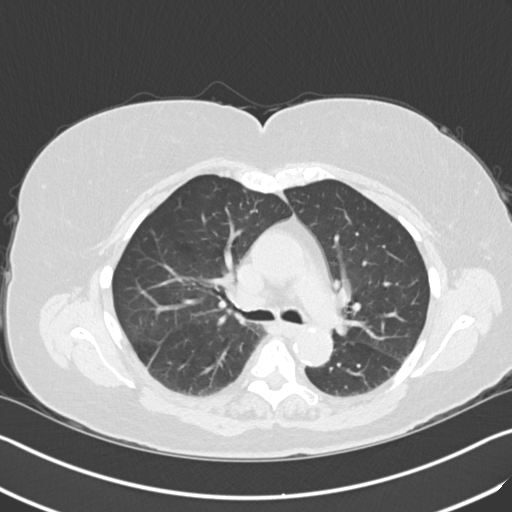
[im 97/138  mediastinal]
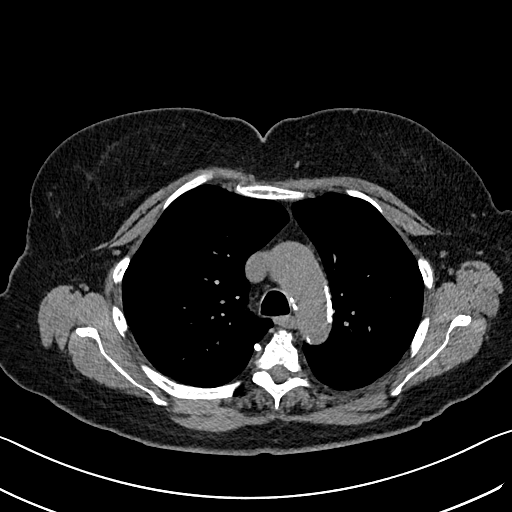
[im 97/138  lung]
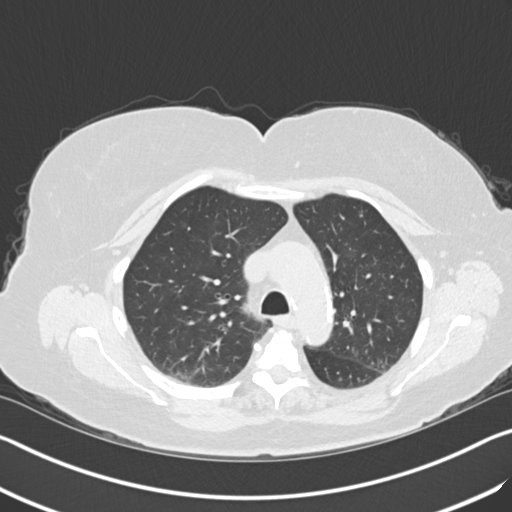
[im 107/138  lung]
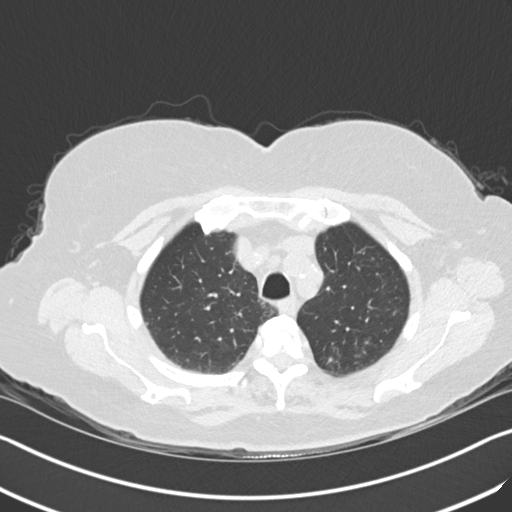
[im 117/138  lung]
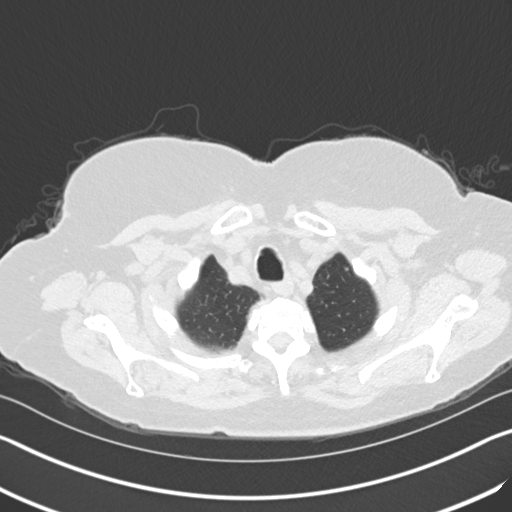
[im 127/138  lung]
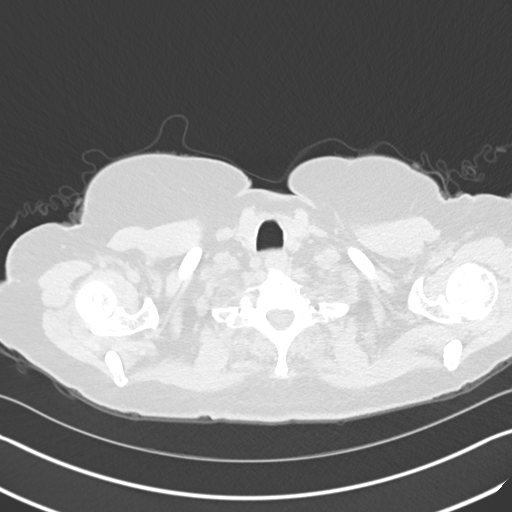

[Series 5: coronal · coronal · 0.59mm/px · 3 of 108 slices shown]
[im 22/108  lung]
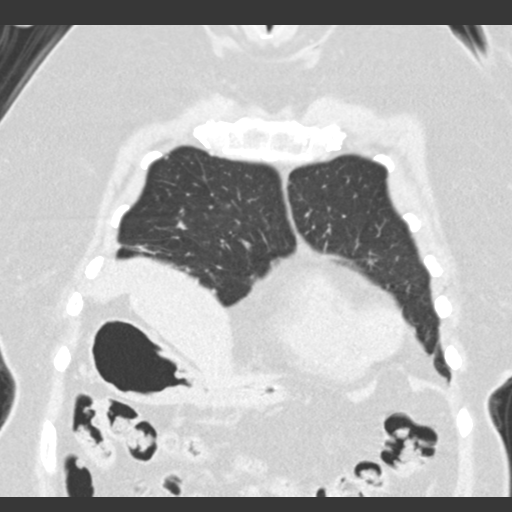
[im 43/108  lung]
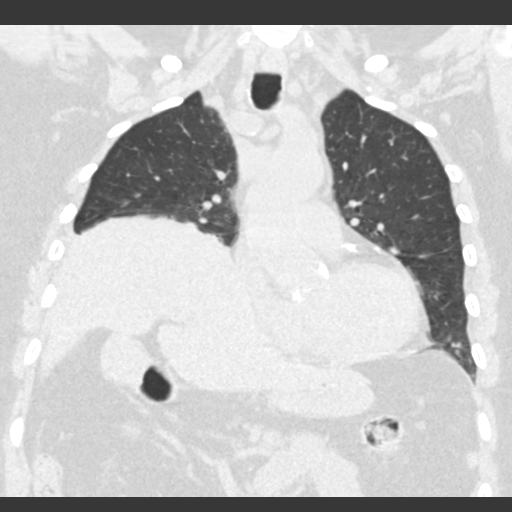
[im 65/108  lung]
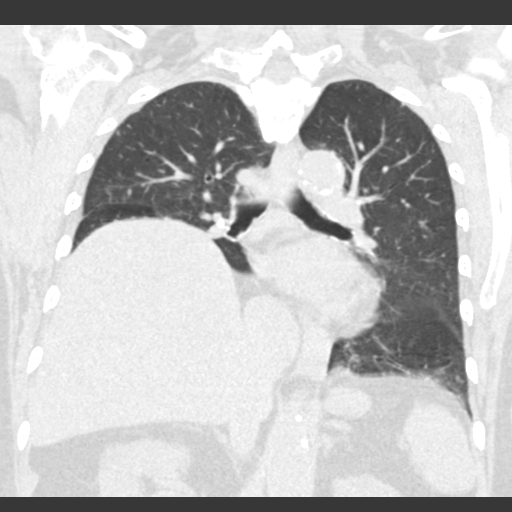

[15 of 36 positions shown; findings below may reference images not displayed]

FINDINGS: Cardiovascular: Moderate aortic and coronary artery calcifications.
No evidence of aortic aneurysm. Heart is normal size.

Mediastinum/Nodes: Calcified low right paratracheal and right
mediastinal lymph nodes. No mediastinal, hilar or axillary
adenopathy.

Lungs/Pleura: Calcified granulomas noted at the right lung base in
the right middle lobe and right lower lobe. Elevation of the right
hemidiaphragm has progressed since prior chest x-ray. Right base
atelectasis. Left lung clear. No effusions.

Noncalcified nodule in the anterior right middle lobe measures 7 mm.

Upper Abdomen: Imaging into the upper abdomen shows no acute
findings. Prior cholecystectomy

Musculoskeletal: Chest wall soft tissues are unremarkable. No acute
bony abnormality.
IMPRESSION: Elevation of the right hemidiaphragm, progressed since prior chest
x-ray. Right base atelectasis.

Old granulomatous disease with calcified granulomas in the right
lung and calcified right hilar and paratracheal lymph nodes. No
adenopathy.

Noncalcified 7 mm right middle lobe nodule. Non-contrast chest CT at
6-12 months is recommended. If the nodule is stable at time of
repeat CT, then future CT at 18-24 months (from today's scan) is
considered optional for low-risk patients, but is recommended for
high-risk patients. This recommendation follows the consensus
statement: Guidelines for Management of Incidental Pulmonary Nodules
Detected on CT Images: From the [HOSPITAL] 5438; Radiology
5438; [DATE].

Aortic Atherosclerosis ([id number]).  Coronary artery disease.

## 2019-05-15 DIAGNOSIS — M545 Low back pain: Secondary | ICD-10-CM | POA: Diagnosis not present

## 2019-05-16 DIAGNOSIS — M25511 Pain in right shoulder: Secondary | ICD-10-CM | POA: Diagnosis not present

## 2019-05-22 DIAGNOSIS — M545 Low back pain: Secondary | ICD-10-CM | POA: Diagnosis not present

## 2019-05-24 DIAGNOSIS — K5900 Constipation, unspecified: Secondary | ICD-10-CM | POA: Diagnosis not present

## 2019-05-24 DIAGNOSIS — N1831 Chronic kidney disease, stage 3a: Secondary | ICD-10-CM | POA: Diagnosis not present

## 2019-05-24 DIAGNOSIS — F418 Other specified anxiety disorders: Secondary | ICD-10-CM | POA: Diagnosis not present

## 2019-05-24 DIAGNOSIS — I129 Hypertensive chronic kidney disease with stage 1 through stage 4 chronic kidney disease, or unspecified chronic kidney disease: Secondary | ICD-10-CM | POA: Diagnosis not present

## 2019-05-24 DIAGNOSIS — E669 Obesity, unspecified: Secondary | ICD-10-CM | POA: Diagnosis not present

## 2019-05-29 DIAGNOSIS — M545 Low back pain: Secondary | ICD-10-CM | POA: Diagnosis not present

## 2019-06-06 DIAGNOSIS — H6123 Impacted cerumen, bilateral: Secondary | ICD-10-CM | POA: Diagnosis not present

## 2019-06-06 DIAGNOSIS — H903 Sensorineural hearing loss, bilateral: Secondary | ICD-10-CM | POA: Diagnosis not present

## 2019-06-06 DIAGNOSIS — H838X3 Other specified diseases of inner ear, bilateral: Secondary | ICD-10-CM | POA: Diagnosis not present

## 2019-06-07 DIAGNOSIS — M545 Low back pain: Secondary | ICD-10-CM | POA: Diagnosis not present

## 2019-06-11 DIAGNOSIS — R82998 Other abnormal findings in urine: Secondary | ICD-10-CM | POA: Diagnosis not present

## 2019-06-11 DIAGNOSIS — I10 Essential (primary) hypertension: Secondary | ICD-10-CM | POA: Diagnosis not present

## 2019-06-12 DIAGNOSIS — M545 Low back pain: Secondary | ICD-10-CM | POA: Diagnosis not present

## 2019-06-19 DIAGNOSIS — M545 Low back pain: Secondary | ICD-10-CM | POA: Diagnosis not present

## 2019-06-26 DIAGNOSIS — M545 Low back pain: Secondary | ICD-10-CM | POA: Diagnosis not present

## 2019-07-03 DIAGNOSIS — M545 Low back pain: Secondary | ICD-10-CM | POA: Diagnosis not present

## 2019-07-10 DIAGNOSIS — M545 Low back pain: Secondary | ICD-10-CM | POA: Diagnosis not present

## 2019-08-14 ENCOUNTER — Other Ambulatory Visit: Payer: Medicare Other

## 2019-08-24 ENCOUNTER — Ambulatory Visit: Payer: Medicare Other

## 2019-08-31 DIAGNOSIS — N390 Urinary tract infection, site not specified: Secondary | ICD-10-CM | POA: Diagnosis not present

## 2019-09-01 ENCOUNTER — Ambulatory Visit: Payer: Medicare Other | Attending: Internal Medicine

## 2019-09-01 DIAGNOSIS — Z23 Encounter for immunization: Secondary | ICD-10-CM

## 2019-09-01 NOTE — Progress Notes (Signed)
   Covid-19 Vaccination Clinic  Name:  Carla [name]    MRN: [id number] DOB: 06/07/1936  09/01/2019  Carla [name] was observed post Covid-19 immunization for 15 minutes without incidence. She was provided with Vaccine Information Sheet and instruction to access the V-Safe system.   Carla [name] was instructed to call 911 with any severe reactions post vaccine: Marland Kitchen Difficulty breathing  . Swelling of your face and throat  . A fast heartbeat  . A bad rash all over your body  . Dizziness and weakness    Immunizations Administered    Name Date Dose VIS Date Route   Pfizer COVID-19 Vaccine 09/01/2019  2:41 PM 0.3 mL 07/06/2019 Intramuscular   Manufacturer: [place]   Lot: [id number]   [place]: [id number]

## 2019-09-04 ENCOUNTER — Ambulatory Visit: Payer: Medicare Other

## 2019-09-26 ENCOUNTER — Ambulatory Visit: Payer: Medicare Other | Attending: Internal Medicine

## 2019-09-26 DIAGNOSIS — Z23 Encounter for immunization: Secondary | ICD-10-CM | POA: Insufficient documentation

## 2019-09-26 NOTE — Progress Notes (Signed)
   Covid-19 Vaccination Clinic  Name:  Carla [name]    MRN: [id number] DOB: 09/23/1935  09/26/2019  Carla [name] was observed post Covid-19 immunization for 15 minutes without incident. She was provided with Vaccine Information Sheet and instruction to access the V-Safe system.   Carla [name] was instructed to call 911 with any severe reactions post vaccine: Marland Kitchen Difficulty breathing  . Swelling of face and throat  . A fast heartbeat  . A bad rash all over body  . Dizziness and weakness   Immunizations Administered    Name Date Dose VIS Date Route   Pfizer COVID-19 Vaccine 09/26/2019 12:21 PM 0.3 mL 07/06/2019 Intramuscular   Manufacturer: [place]   Lot: [id number]   [place]: [id number]

## 2019-10-03 DIAGNOSIS — K219 Gastro-esophageal reflux disease without esophagitis: Secondary | ICD-10-CM | POA: Diagnosis not present

## 2019-10-03 DIAGNOSIS — N3010 Interstitial cystitis (chronic) without hematuria: Secondary | ICD-10-CM | POA: Diagnosis not present

## 2019-10-03 DIAGNOSIS — F418 Other specified anxiety disorders: Secondary | ICD-10-CM | POA: Diagnosis not present

## 2019-10-03 DIAGNOSIS — E039 Hypothyroidism, unspecified: Secondary | ICD-10-CM | POA: Diagnosis not present

## 2019-10-03 DIAGNOSIS — M109 Gout, unspecified: Secondary | ICD-10-CM | POA: Diagnosis not present

## 2019-10-03 DIAGNOSIS — I129 Hypertensive chronic kidney disease with stage 1 through stage 4 chronic kidney disease, or unspecified chronic kidney disease: Secondary | ICD-10-CM | POA: Diagnosis not present

## 2019-10-03 DIAGNOSIS — N1831 Chronic kidney disease, stage 3a: Secondary | ICD-10-CM | POA: Diagnosis not present

## 2019-10-03 DIAGNOSIS — E785 Hyperlipidemia, unspecified: Secondary | ICD-10-CM | POA: Diagnosis not present

## 2019-10-03 DIAGNOSIS — E669 Obesity, unspecified: Secondary | ICD-10-CM | POA: Diagnosis not present

## 2019-10-03 DIAGNOSIS — M8580 Other specified disorders of bone density and structure, unspecified site: Secondary | ICD-10-CM | POA: Diagnosis not present

## 2019-10-03 DIAGNOSIS — K589 Irritable bowel syndrome without diarrhea: Secondary | ICD-10-CM | POA: Diagnosis not present

## 2019-10-03 DIAGNOSIS — J45909 Unspecified asthma, uncomplicated: Secondary | ICD-10-CM | POA: Diagnosis not present

## 2019-10-17 DIAGNOSIS — H6123 Impacted cerumen, bilateral: Secondary | ICD-10-CM | POA: Diagnosis not present

## 2019-10-23 DIAGNOSIS — F418 Other specified anxiety disorders: Secondary | ICD-10-CM | POA: Diagnosis not present

## 2019-11-06 DIAGNOSIS — H04123 Dry eye syndrome of bilateral lacrimal glands: Secondary | ICD-10-CM | POA: Diagnosis not present

## 2019-11-06 DIAGNOSIS — H2512 Age-related nuclear cataract, left eye: Secondary | ICD-10-CM | POA: Diagnosis not present

## 2019-11-06 DIAGNOSIS — Z961 Presence of intraocular lens: Secondary | ICD-10-CM | POA: Diagnosis not present

## 2019-11-22 DIAGNOSIS — F418 Other specified anxiety disorders: Secondary | ICD-10-CM | POA: Diagnosis not present

## 2019-12-03 DIAGNOSIS — Z209 Contact with and (suspected) exposure to unspecified communicable disease: Secondary | ICD-10-CM | POA: Diagnosis not present

## 2019-12-03 DIAGNOSIS — Z20822 Contact with and (suspected) exposure to covid-19: Secondary | ICD-10-CM | POA: Diagnosis not present

## 2019-12-03 DIAGNOSIS — R0981 Nasal congestion: Secondary | ICD-10-CM | POA: Diagnosis not present

## 2019-12-03 DIAGNOSIS — J029 Acute pharyngitis, unspecified: Secondary | ICD-10-CM | POA: Diagnosis not present

## 2020-01-08 DIAGNOSIS — J4540 Moderate persistent asthma, uncomplicated: Secondary | ICD-10-CM | POA: Diagnosis not present

## 2020-01-08 DIAGNOSIS — R0600 Dyspnea, unspecified: Secondary | ICD-10-CM | POA: Diagnosis not present

## 2020-01-08 DIAGNOSIS — R05 Cough: Secondary | ICD-10-CM | POA: Diagnosis not present

## 2020-01-08 DIAGNOSIS — R0602 Shortness of breath: Secondary | ICD-10-CM | POA: Diagnosis not present

## 2020-01-08 DIAGNOSIS — J9601 Acute respiratory failure with hypoxia: Secondary | ICD-10-CM | POA: Diagnosis not present

## 2020-01-09 DIAGNOSIS — R590 Localized enlarged lymph nodes: Secondary | ICD-10-CM | POA: Diagnosis not present

## 2020-01-09 DIAGNOSIS — R0600 Dyspnea, unspecified: Secondary | ICD-10-CM | POA: Diagnosis not present

## 2020-01-09 DIAGNOSIS — R05 Cough: Secondary | ICD-10-CM | POA: Diagnosis not present

## 2020-01-09 DIAGNOSIS — J9601 Acute respiratory failure with hypoxia: Secondary | ICD-10-CM | POA: Diagnosis not present

## 2020-01-10 DIAGNOSIS — I350 Nonrheumatic aortic (valve) stenosis: Secondary | ICD-10-CM | POA: Diagnosis not present

## 2020-01-10 DIAGNOSIS — I371 Nonrheumatic pulmonary valve insufficiency: Secondary | ICD-10-CM | POA: Diagnosis not present

## 2020-01-10 DIAGNOSIS — R590 Localized enlarged lymph nodes: Secondary | ICD-10-CM | POA: Diagnosis not present

## 2020-01-10 DIAGNOSIS — R0600 Dyspnea, unspecified: Secondary | ICD-10-CM | POA: Diagnosis not present

## 2020-01-10 DIAGNOSIS — R0602 Shortness of breath: Secondary | ICD-10-CM | POA: Diagnosis not present

## 2020-01-10 DIAGNOSIS — J9601 Acute respiratory failure with hypoxia: Secondary | ICD-10-CM | POA: Diagnosis not present

## 2020-01-10 DIAGNOSIS — R05 Cough: Secondary | ICD-10-CM | POA: Diagnosis not present

## 2020-01-10 DIAGNOSIS — I340 Nonrheumatic mitral (valve) insufficiency: Secondary | ICD-10-CM | POA: Diagnosis not present

## 2020-01-11 DIAGNOSIS — J4540 Moderate persistent asthma, uncomplicated: Secondary | ICD-10-CM | POA: Diagnosis not present

## 2020-01-11 DIAGNOSIS — R590 Localized enlarged lymph nodes: Secondary | ICD-10-CM | POA: Diagnosis not present

## 2020-01-11 DIAGNOSIS — J9601 Acute respiratory failure with hypoxia: Secondary | ICD-10-CM | POA: Diagnosis not present

## 2020-01-11 DIAGNOSIS — R0600 Dyspnea, unspecified: Secondary | ICD-10-CM | POA: Diagnosis not present

## 2020-01-11 DIAGNOSIS — R05 Cough: Secondary | ICD-10-CM | POA: Diagnosis not present

## 2020-01-16 DIAGNOSIS — E669 Obesity, unspecified: Secondary | ICD-10-CM | POA: Diagnosis not present

## 2020-01-16 DIAGNOSIS — Z7952 Long term (current) use of systemic steroids: Secondary | ICD-10-CM | POA: Diagnosis not present

## 2020-01-16 DIAGNOSIS — E039 Hypothyroidism, unspecified: Secondary | ICD-10-CM | POA: Diagnosis not present

## 2020-01-16 DIAGNOSIS — Z87891 Personal history of nicotine dependence: Secondary | ICD-10-CM | POA: Diagnosis not present

## 2020-01-16 DIAGNOSIS — J9601 Acute respiratory failure with hypoxia: Secondary | ICD-10-CM | POA: Diagnosis not present

## 2020-01-16 DIAGNOSIS — I10 Essential (primary) hypertension: Secondary | ICD-10-CM | POA: Diagnosis not present

## 2020-01-16 DIAGNOSIS — Z6829 Body mass index (BMI) 29.0-29.9, adult: Secondary | ICD-10-CM | POA: Diagnosis not present

## 2020-01-16 DIAGNOSIS — Z9181 History of falling: Secondary | ICD-10-CM | POA: Diagnosis not present

## 2020-01-16 DIAGNOSIS — J309 Allergic rhinitis, unspecified: Secondary | ICD-10-CM | POA: Diagnosis not present

## 2020-01-16 DIAGNOSIS — Z7951 Long term (current) use of inhaled steroids: Secondary | ICD-10-CM | POA: Diagnosis not present

## 2020-01-16 DIAGNOSIS — R32 Unspecified urinary incontinence: Secondary | ICD-10-CM | POA: Diagnosis not present

## 2020-01-16 DIAGNOSIS — Z9981 Dependence on supplemental oxygen: Secondary | ICD-10-CM | POA: Diagnosis not present

## 2020-01-16 DIAGNOSIS — R590 Localized enlarged lymph nodes: Secondary | ICD-10-CM | POA: Diagnosis not present

## 2020-01-16 DIAGNOSIS — J4541 Moderate persistent asthma with (acute) exacerbation: Secondary | ICD-10-CM | POA: Diagnosis not present

## 2020-01-16 DIAGNOSIS — K219 Gastro-esophageal reflux disease without esophagitis: Secondary | ICD-10-CM | POA: Diagnosis not present

## 2020-01-22 DIAGNOSIS — J4541 Moderate persistent asthma with (acute) exacerbation: Secondary | ICD-10-CM | POA: Diagnosis not present

## 2020-01-22 DIAGNOSIS — J9601 Acute respiratory failure with hypoxia: Secondary | ICD-10-CM | POA: Diagnosis not present

## 2020-01-22 DIAGNOSIS — R590 Localized enlarged lymph nodes: Secondary | ICD-10-CM | POA: Diagnosis not present

## 2020-01-22 DIAGNOSIS — I10 Essential (primary) hypertension: Secondary | ICD-10-CM | POA: Diagnosis not present

## 2020-01-22 DIAGNOSIS — E039 Hypothyroidism, unspecified: Secondary | ICD-10-CM | POA: Diagnosis not present

## 2020-01-22 DIAGNOSIS — K219 Gastro-esophageal reflux disease without esophagitis: Secondary | ICD-10-CM | POA: Diagnosis not present

## 2020-01-23 DIAGNOSIS — J9601 Acute respiratory failure with hypoxia: Secondary | ICD-10-CM | POA: Diagnosis not present

## 2020-01-23 DIAGNOSIS — R590 Localized enlarged lymph nodes: Secondary | ICD-10-CM | POA: Diagnosis not present

## 2020-01-23 DIAGNOSIS — I10 Essential (primary) hypertension: Secondary | ICD-10-CM | POA: Diagnosis not present

## 2020-01-23 DIAGNOSIS — E039 Hypothyroidism, unspecified: Secondary | ICD-10-CM | POA: Diagnosis not present

## 2020-01-23 DIAGNOSIS — K219 Gastro-esophageal reflux disease without esophagitis: Secondary | ICD-10-CM | POA: Diagnosis not present

## 2020-01-23 DIAGNOSIS — J4541 Moderate persistent asthma with (acute) exacerbation: Secondary | ICD-10-CM | POA: Diagnosis not present

## 2020-01-25 DIAGNOSIS — J4541 Moderate persistent asthma with (acute) exacerbation: Secondary | ICD-10-CM | POA: Diagnosis not present

## 2020-01-25 DIAGNOSIS — K219 Gastro-esophageal reflux disease without esophagitis: Secondary | ICD-10-CM | POA: Diagnosis not present

## 2020-01-25 DIAGNOSIS — R590 Localized enlarged lymph nodes: Secondary | ICD-10-CM | POA: Diagnosis not present

## 2020-01-25 DIAGNOSIS — E039 Hypothyroidism, unspecified: Secondary | ICD-10-CM | POA: Diagnosis not present

## 2020-01-25 DIAGNOSIS — I10 Essential (primary) hypertension: Secondary | ICD-10-CM | POA: Diagnosis not present

## 2020-01-25 DIAGNOSIS — J9601 Acute respiratory failure with hypoxia: Secondary | ICD-10-CM | POA: Diagnosis not present

## 2020-01-29 DIAGNOSIS — J4541 Moderate persistent asthma with (acute) exacerbation: Secondary | ICD-10-CM | POA: Diagnosis not present

## 2020-01-29 DIAGNOSIS — R590 Localized enlarged lymph nodes: Secondary | ICD-10-CM | POA: Diagnosis not present

## 2020-01-29 DIAGNOSIS — K219 Gastro-esophageal reflux disease without esophagitis: Secondary | ICD-10-CM | POA: Diagnosis not present

## 2020-01-29 DIAGNOSIS — J9601 Acute respiratory failure with hypoxia: Secondary | ICD-10-CM | POA: Diagnosis not present

## 2020-01-29 DIAGNOSIS — I10 Essential (primary) hypertension: Secondary | ICD-10-CM | POA: Diagnosis not present

## 2020-01-29 DIAGNOSIS — E039 Hypothyroidism, unspecified: Secondary | ICD-10-CM | POA: Diagnosis not present

## 2020-01-30 DIAGNOSIS — J4541 Moderate persistent asthma with (acute) exacerbation: Secondary | ICD-10-CM | POA: Diagnosis not present

## 2020-01-30 DIAGNOSIS — R590 Localized enlarged lymph nodes: Secondary | ICD-10-CM | POA: Diagnosis not present

## 2020-01-30 DIAGNOSIS — E039 Hypothyroidism, unspecified: Secondary | ICD-10-CM | POA: Diagnosis not present

## 2020-01-30 DIAGNOSIS — I10 Essential (primary) hypertension: Secondary | ICD-10-CM | POA: Diagnosis not present

## 2020-01-30 DIAGNOSIS — K219 Gastro-esophageal reflux disease without esophagitis: Secondary | ICD-10-CM | POA: Diagnosis not present

## 2020-01-30 DIAGNOSIS — J9601 Acute respiratory failure with hypoxia: Secondary | ICD-10-CM | POA: Diagnosis not present

## 2020-01-31 DIAGNOSIS — K219 Gastro-esophageal reflux disease without esophagitis: Secondary | ICD-10-CM | POA: Diagnosis not present

## 2020-01-31 DIAGNOSIS — E039 Hypothyroidism, unspecified: Secondary | ICD-10-CM | POA: Diagnosis not present

## 2020-01-31 DIAGNOSIS — R590 Localized enlarged lymph nodes: Secondary | ICD-10-CM | POA: Diagnosis not present

## 2020-01-31 DIAGNOSIS — J4541 Moderate persistent asthma with (acute) exacerbation: Secondary | ICD-10-CM | POA: Diagnosis not present

## 2020-01-31 DIAGNOSIS — I10 Essential (primary) hypertension: Secondary | ICD-10-CM | POA: Diagnosis not present

## 2020-01-31 DIAGNOSIS — J9601 Acute respiratory failure with hypoxia: Secondary | ICD-10-CM | POA: Diagnosis not present

## 2020-02-05 DIAGNOSIS — J9601 Acute respiratory failure with hypoxia: Secondary | ICD-10-CM | POA: Diagnosis not present

## 2020-02-05 DIAGNOSIS — I10 Essential (primary) hypertension: Secondary | ICD-10-CM | POA: Diagnosis not present

## 2020-02-05 DIAGNOSIS — E039 Hypothyroidism, unspecified: Secondary | ICD-10-CM | POA: Diagnosis not present

## 2020-02-05 DIAGNOSIS — J4541 Moderate persistent asthma with (acute) exacerbation: Secondary | ICD-10-CM | POA: Diagnosis not present

## 2020-02-05 DIAGNOSIS — K219 Gastro-esophageal reflux disease without esophagitis: Secondary | ICD-10-CM | POA: Diagnosis not present

## 2020-02-05 DIAGNOSIS — R590 Localized enlarged lymph nodes: Secondary | ICD-10-CM | POA: Diagnosis not present

## 2020-02-06 DIAGNOSIS — K219 Gastro-esophageal reflux disease without esophagitis: Secondary | ICD-10-CM | POA: Diagnosis not present

## 2020-02-06 DIAGNOSIS — J4541 Moderate persistent asthma with (acute) exacerbation: Secondary | ICD-10-CM | POA: Diagnosis not present

## 2020-02-06 DIAGNOSIS — J9601 Acute respiratory failure with hypoxia: Secondary | ICD-10-CM | POA: Diagnosis not present

## 2020-02-06 DIAGNOSIS — E039 Hypothyroidism, unspecified: Secondary | ICD-10-CM | POA: Diagnosis not present

## 2020-02-06 DIAGNOSIS — I10 Essential (primary) hypertension: Secondary | ICD-10-CM | POA: Diagnosis not present

## 2020-02-06 DIAGNOSIS — R590 Localized enlarged lymph nodes: Secondary | ICD-10-CM | POA: Diagnosis not present

## 2020-02-07 DIAGNOSIS — J4541 Moderate persistent asthma with (acute) exacerbation: Secondary | ICD-10-CM | POA: Diagnosis not present

## 2020-02-07 DIAGNOSIS — R590 Localized enlarged lymph nodes: Secondary | ICD-10-CM | POA: Diagnosis not present

## 2020-02-07 DIAGNOSIS — K219 Gastro-esophageal reflux disease without esophagitis: Secondary | ICD-10-CM | POA: Diagnosis not present

## 2020-02-07 DIAGNOSIS — E039 Hypothyroidism, unspecified: Secondary | ICD-10-CM | POA: Diagnosis not present

## 2020-02-07 DIAGNOSIS — J9601 Acute respiratory failure with hypoxia: Secondary | ICD-10-CM | POA: Diagnosis not present

## 2020-02-07 DIAGNOSIS — I10 Essential (primary) hypertension: Secondary | ICD-10-CM | POA: Diagnosis not present

## 2020-02-08 DIAGNOSIS — J4540 Moderate persistent asthma, uncomplicated: Secondary | ICD-10-CM | POA: Diagnosis not present

## 2020-02-08 DIAGNOSIS — Q791 Other congenital malformations of diaphragm: Secondary | ICD-10-CM | POA: Diagnosis not present

## 2020-02-08 DIAGNOSIS — J9611 Chronic respiratory failure with hypoxia: Secondary | ICD-10-CM | POA: Diagnosis not present

## 2020-02-13 DIAGNOSIS — J4541 Moderate persistent asthma with (acute) exacerbation: Secondary | ICD-10-CM | POA: Diagnosis not present

## 2020-02-13 DIAGNOSIS — R590 Localized enlarged lymph nodes: Secondary | ICD-10-CM | POA: Diagnosis not present

## 2020-02-13 DIAGNOSIS — K219 Gastro-esophageal reflux disease without esophagitis: Secondary | ICD-10-CM | POA: Diagnosis not present

## 2020-02-13 DIAGNOSIS — J9601 Acute respiratory failure with hypoxia: Secondary | ICD-10-CM | POA: Diagnosis not present

## 2020-02-13 DIAGNOSIS — I10 Essential (primary) hypertension: Secondary | ICD-10-CM | POA: Diagnosis not present

## 2020-02-13 DIAGNOSIS — E039 Hypothyroidism, unspecified: Secondary | ICD-10-CM | POA: Diagnosis not present

## 2020-02-15 DIAGNOSIS — I10 Essential (primary) hypertension: Secondary | ICD-10-CM | POA: Diagnosis not present

## 2020-02-15 DIAGNOSIS — K219 Gastro-esophageal reflux disease without esophagitis: Secondary | ICD-10-CM | POA: Diagnosis not present

## 2020-02-15 DIAGNOSIS — J4541 Moderate persistent asthma with (acute) exacerbation: Secondary | ICD-10-CM | POA: Diagnosis not present

## 2020-02-15 DIAGNOSIS — R32 Unspecified urinary incontinence: Secondary | ICD-10-CM | POA: Diagnosis not present

## 2020-02-15 DIAGNOSIS — R590 Localized enlarged lymph nodes: Secondary | ICD-10-CM | POA: Diagnosis not present

## 2020-02-15 DIAGNOSIS — Z9181 History of falling: Secondary | ICD-10-CM | POA: Diagnosis not present

## 2020-02-15 DIAGNOSIS — Z7951 Long term (current) use of inhaled steroids: Secondary | ICD-10-CM | POA: Diagnosis not present

## 2020-02-15 DIAGNOSIS — Z87891 Personal history of nicotine dependence: Secondary | ICD-10-CM | POA: Diagnosis not present

## 2020-02-15 DIAGNOSIS — J9601 Acute respiratory failure with hypoxia: Secondary | ICD-10-CM | POA: Diagnosis not present

## 2020-02-15 DIAGNOSIS — Z9981 Dependence on supplemental oxygen: Secondary | ICD-10-CM | POA: Diagnosis not present

## 2020-02-15 DIAGNOSIS — E039 Hypothyroidism, unspecified: Secondary | ICD-10-CM | POA: Diagnosis not present

## 2020-02-15 DIAGNOSIS — Z6829 Body mass index (BMI) 29.0-29.9, adult: Secondary | ICD-10-CM | POA: Diagnosis not present

## 2020-02-15 DIAGNOSIS — Z7952 Long term (current) use of systemic steroids: Secondary | ICD-10-CM | POA: Diagnosis not present

## 2020-02-15 DIAGNOSIS — E669 Obesity, unspecified: Secondary | ICD-10-CM | POA: Diagnosis not present

## 2020-02-15 DIAGNOSIS — J309 Allergic rhinitis, unspecified: Secondary | ICD-10-CM | POA: Diagnosis not present

## 2020-02-19 DIAGNOSIS — E039 Hypothyroidism, unspecified: Secondary | ICD-10-CM | POA: Diagnosis not present

## 2020-02-19 DIAGNOSIS — I739 Peripheral vascular disease, unspecified: Secondary | ICD-10-CM | POA: Diagnosis not present

## 2020-02-19 DIAGNOSIS — R590 Localized enlarged lymph nodes: Secondary | ICD-10-CM | POA: Diagnosis not present

## 2020-02-19 DIAGNOSIS — J9601 Acute respiratory failure with hypoxia: Secondary | ICD-10-CM | POA: Diagnosis not present

## 2020-02-19 DIAGNOSIS — I10 Essential (primary) hypertension: Secondary | ICD-10-CM | POA: Diagnosis not present

## 2020-02-19 DIAGNOSIS — J4541 Moderate persistent asthma with (acute) exacerbation: Secondary | ICD-10-CM | POA: Diagnosis not present

## 2020-02-19 DIAGNOSIS — K219 Gastro-esophageal reflux disease without esophagitis: Secondary | ICD-10-CM | POA: Diagnosis not present

## 2020-02-20 DIAGNOSIS — J4541 Moderate persistent asthma with (acute) exacerbation: Secondary | ICD-10-CM | POA: Diagnosis not present

## 2020-02-20 DIAGNOSIS — J9601 Acute respiratory failure with hypoxia: Secondary | ICD-10-CM | POA: Diagnosis not present

## 2020-02-20 DIAGNOSIS — R590 Localized enlarged lymph nodes: Secondary | ICD-10-CM | POA: Diagnosis not present

## 2020-02-20 DIAGNOSIS — I10 Essential (primary) hypertension: Secondary | ICD-10-CM | POA: Diagnosis not present

## 2020-02-20 DIAGNOSIS — K219 Gastro-esophageal reflux disease without esophagitis: Secondary | ICD-10-CM | POA: Diagnosis not present

## 2020-02-20 DIAGNOSIS — E039 Hypothyroidism, unspecified: Secondary | ICD-10-CM | POA: Diagnosis not present

## 2020-02-22 DIAGNOSIS — J4541 Moderate persistent asthma with (acute) exacerbation: Secondary | ICD-10-CM | POA: Diagnosis not present

## 2020-02-22 DIAGNOSIS — K219 Gastro-esophageal reflux disease without esophagitis: Secondary | ICD-10-CM | POA: Diagnosis not present

## 2020-02-22 DIAGNOSIS — E039 Hypothyroidism, unspecified: Secondary | ICD-10-CM | POA: Diagnosis not present

## 2020-02-22 DIAGNOSIS — J9611 Chronic respiratory failure with hypoxia: Secondary | ICD-10-CM | POA: Diagnosis not present

## 2020-02-22 DIAGNOSIS — J9601 Acute respiratory failure with hypoxia: Secondary | ICD-10-CM | POA: Diagnosis not present

## 2020-02-22 DIAGNOSIS — R590 Localized enlarged lymph nodes: Secondary | ICD-10-CM | POA: Diagnosis not present

## 2020-02-22 DIAGNOSIS — I10 Essential (primary) hypertension: Secondary | ICD-10-CM | POA: Diagnosis not present

## 2020-02-25 DIAGNOSIS — J4541 Moderate persistent asthma with (acute) exacerbation: Secondary | ICD-10-CM | POA: Diagnosis not present

## 2020-02-25 DIAGNOSIS — E039 Hypothyroidism, unspecified: Secondary | ICD-10-CM | POA: Diagnosis not present

## 2020-02-25 DIAGNOSIS — J9601 Acute respiratory failure with hypoxia: Secondary | ICD-10-CM | POA: Diagnosis not present

## 2020-02-25 DIAGNOSIS — K219 Gastro-esophageal reflux disease without esophagitis: Secondary | ICD-10-CM | POA: Diagnosis not present

## 2020-02-25 DIAGNOSIS — R590 Localized enlarged lymph nodes: Secondary | ICD-10-CM | POA: Diagnosis not present

## 2020-02-25 DIAGNOSIS — I10 Essential (primary) hypertension: Secondary | ICD-10-CM | POA: Diagnosis not present

## 2020-02-27 DIAGNOSIS — J4541 Moderate persistent asthma with (acute) exacerbation: Secondary | ICD-10-CM | POA: Diagnosis not present

## 2020-02-27 DIAGNOSIS — E039 Hypothyroidism, unspecified: Secondary | ICD-10-CM | POA: Diagnosis not present

## 2020-02-27 DIAGNOSIS — J9601 Acute respiratory failure with hypoxia: Secondary | ICD-10-CM | POA: Diagnosis not present

## 2020-02-27 DIAGNOSIS — K219 Gastro-esophageal reflux disease without esophagitis: Secondary | ICD-10-CM | POA: Diagnosis not present

## 2020-02-27 DIAGNOSIS — I10 Essential (primary) hypertension: Secondary | ICD-10-CM | POA: Diagnosis not present

## 2020-02-27 DIAGNOSIS — R590 Localized enlarged lymph nodes: Secondary | ICD-10-CM | POA: Diagnosis not present

## 2020-02-28 DIAGNOSIS — J9601 Acute respiratory failure with hypoxia: Secondary | ICD-10-CM | POA: Diagnosis not present

## 2020-02-28 DIAGNOSIS — J4540 Moderate persistent asthma, uncomplicated: Secondary | ICD-10-CM | POA: Diagnosis not present

## 2020-02-28 DIAGNOSIS — E039 Hypothyroidism, unspecified: Secondary | ICD-10-CM | POA: Diagnosis not present

## 2020-02-28 DIAGNOSIS — Q791 Other congenital malformations of diaphragm: Secondary | ICD-10-CM | POA: Diagnosis not present

## 2020-02-28 DIAGNOSIS — I10 Essential (primary) hypertension: Secondary | ICD-10-CM | POA: Diagnosis not present

## 2020-02-28 DIAGNOSIS — J4541 Moderate persistent asthma with (acute) exacerbation: Secondary | ICD-10-CM | POA: Diagnosis not present

## 2020-02-28 DIAGNOSIS — J9611 Chronic respiratory failure with hypoxia: Secondary | ICD-10-CM | POA: Diagnosis not present

## 2020-02-28 DIAGNOSIS — R590 Localized enlarged lymph nodes: Secondary | ICD-10-CM | POA: Diagnosis not present

## 2020-02-28 DIAGNOSIS — K219 Gastro-esophageal reflux disease without esophagitis: Secondary | ICD-10-CM | POA: Diagnosis not present

## 2020-03-03 DIAGNOSIS — E039 Hypothyroidism, unspecified: Secondary | ICD-10-CM | POA: Diagnosis not present

## 2020-03-03 DIAGNOSIS — J4541 Moderate persistent asthma with (acute) exacerbation: Secondary | ICD-10-CM | POA: Diagnosis not present

## 2020-03-03 DIAGNOSIS — J9601 Acute respiratory failure with hypoxia: Secondary | ICD-10-CM | POA: Diagnosis not present

## 2020-03-03 DIAGNOSIS — I10 Essential (primary) hypertension: Secondary | ICD-10-CM | POA: Diagnosis not present

## 2020-03-03 DIAGNOSIS — K219 Gastro-esophageal reflux disease without esophagitis: Secondary | ICD-10-CM | POA: Diagnosis not present

## 2020-03-03 DIAGNOSIS — R590 Localized enlarged lymph nodes: Secondary | ICD-10-CM | POA: Diagnosis not present

## 2020-03-05 DIAGNOSIS — J9601 Acute respiratory failure with hypoxia: Secondary | ICD-10-CM | POA: Diagnosis not present

## 2020-03-05 DIAGNOSIS — J4541 Moderate persistent asthma with (acute) exacerbation: Secondary | ICD-10-CM | POA: Diagnosis not present

## 2020-03-05 DIAGNOSIS — E039 Hypothyroidism, unspecified: Secondary | ICD-10-CM | POA: Diagnosis not present

## 2020-03-05 DIAGNOSIS — K219 Gastro-esophageal reflux disease without esophagitis: Secondary | ICD-10-CM | POA: Diagnosis not present

## 2020-03-05 DIAGNOSIS — R590 Localized enlarged lymph nodes: Secondary | ICD-10-CM | POA: Diagnosis not present

## 2020-03-05 DIAGNOSIS — I10 Essential (primary) hypertension: Secondary | ICD-10-CM | POA: Diagnosis not present

## 2020-03-14 DIAGNOSIS — H6123 Impacted cerumen, bilateral: Secondary | ICD-10-CM | POA: Diagnosis not present

## 2020-03-14 DIAGNOSIS — H903 Sensorineural hearing loss, bilateral: Secondary | ICD-10-CM | POA: Diagnosis not present

## 2020-03-27 DIAGNOSIS — J9611 Chronic respiratory failure with hypoxia: Secondary | ICD-10-CM | POA: Diagnosis not present

## 2020-03-27 DIAGNOSIS — J449 Chronic obstructive pulmonary disease, unspecified: Secondary | ICD-10-CM | POA: Diagnosis not present

## 2020-03-27 DIAGNOSIS — Q791 Other congenital malformations of diaphragm: Secondary | ICD-10-CM | POA: Diagnosis not present

## 2020-04-15 DIAGNOSIS — H903 Sensorineural hearing loss, bilateral: Secondary | ICD-10-CM | POA: Diagnosis not present

## 2020-04-21 DIAGNOSIS — F411 Generalized anxiety disorder: Secondary | ICD-10-CM | POA: Diagnosis not present

## 2020-04-21 DIAGNOSIS — I739 Peripheral vascular disease, unspecified: Secondary | ICD-10-CM | POA: Diagnosis not present

## 2020-04-21 DIAGNOSIS — Z23 Encounter for immunization: Secondary | ICD-10-CM | POA: Diagnosis not present

## 2020-04-21 DIAGNOSIS — K581 Irritable bowel syndrome with constipation: Secondary | ICD-10-CM | POA: Diagnosis not present

## 2020-04-21 DIAGNOSIS — J4540 Moderate persistent asthma, uncomplicated: Secondary | ICD-10-CM | POA: Diagnosis not present

## 2020-04-21 DIAGNOSIS — D329 Benign neoplasm of meninges, unspecified: Secondary | ICD-10-CM | POA: Diagnosis not present

## 2020-04-21 DIAGNOSIS — N3010 Interstitial cystitis (chronic) without hematuria: Secondary | ICD-10-CM | POA: Diagnosis not present

## 2020-04-21 DIAGNOSIS — M8580 Other specified disorders of bone density and structure, unspecified site: Secondary | ICD-10-CM | POA: Diagnosis not present

## 2020-04-21 DIAGNOSIS — I10 Essential (primary) hypertension: Secondary | ICD-10-CM | POA: Diagnosis not present

## 2020-04-21 DIAGNOSIS — F5104 Psychophysiologic insomnia: Secondary | ICD-10-CM | POA: Diagnosis not present

## 2020-04-21 DIAGNOSIS — E039 Hypothyroidism, unspecified: Secondary | ICD-10-CM | POA: Diagnosis not present

## 2020-04-21 DIAGNOSIS — K219 Gastro-esophageal reflux disease without esophagitis: Secondary | ICD-10-CM | POA: Diagnosis not present

## 2020-05-07 DIAGNOSIS — Z23 Encounter for immunization: Secondary | ICD-10-CM | POA: Diagnosis not present

## 2020-05-09 DIAGNOSIS — J45909 Unspecified asthma, uncomplicated: Secondary | ICD-10-CM | POA: Diagnosis not present

## 2020-05-14 DIAGNOSIS — M109 Gout, unspecified: Secondary | ICD-10-CM | POA: Diagnosis not present

## 2020-05-14 DIAGNOSIS — N3010 Interstitial cystitis (chronic) without hematuria: Secondary | ICD-10-CM | POA: Diagnosis not present

## 2020-05-14 DIAGNOSIS — I10 Essential (primary) hypertension: Secondary | ICD-10-CM | POA: Diagnosis not present

## 2020-05-14 DIAGNOSIS — J4540 Moderate persistent asthma, uncomplicated: Secondary | ICD-10-CM | POA: Diagnosis not present

## 2020-05-14 DIAGNOSIS — M8580 Other specified disorders of bone density and structure, unspecified site: Secondary | ICD-10-CM | POA: Diagnosis not present

## 2020-05-14 DIAGNOSIS — K581 Irritable bowel syndrome with constipation: Secondary | ICD-10-CM | POA: Diagnosis not present

## 2020-05-14 DIAGNOSIS — E785 Hyperlipidemia, unspecified: Secondary | ICD-10-CM | POA: Diagnosis not present

## 2020-05-14 DIAGNOSIS — E039 Hypothyroidism, unspecified: Secondary | ICD-10-CM | POA: Diagnosis not present

## 2020-05-14 DIAGNOSIS — K219 Gastro-esophageal reflux disease without esophagitis: Secondary | ICD-10-CM | POA: Diagnosis not present

## 2020-05-14 DIAGNOSIS — D329 Benign neoplasm of meninges, unspecified: Secondary | ICD-10-CM | POA: Diagnosis not present

## 2020-05-14 DIAGNOSIS — F5104 Psychophysiologic insomnia: Secondary | ICD-10-CM | POA: Diagnosis not present

## 2020-05-14 DIAGNOSIS — F411 Generalized anxiety disorder: Secondary | ICD-10-CM | POA: Diagnosis not present

## 2020-05-15 DIAGNOSIS — E785 Hyperlipidemia, unspecified: Secondary | ICD-10-CM | POA: Diagnosis not present

## 2020-05-15 DIAGNOSIS — I70211 Atherosclerosis of native arteries of extremities with intermittent claudication, right leg: Secondary | ICD-10-CM | POA: Diagnosis not present

## 2020-05-15 DIAGNOSIS — I10 Essential (primary) hypertension: Secondary | ICD-10-CM | POA: Diagnosis not present

## 2020-05-15 DIAGNOSIS — E039 Hypothyroidism, unspecified: Secondary | ICD-10-CM | POA: Diagnosis not present

## 2020-05-15 DIAGNOSIS — J9611 Chronic respiratory failure with hypoxia: Secondary | ICD-10-CM | POA: Diagnosis not present

## 2020-06-03 DIAGNOSIS — J439 Emphysema, unspecified: Secondary | ICD-10-CM | POA: Diagnosis not present

## 2020-06-12 DIAGNOSIS — N898 Other specified noninflammatory disorders of vagina: Secondary | ICD-10-CM | POA: Diagnosis not present

## 2020-06-12 DIAGNOSIS — N3010 Interstitial cystitis (chronic) without hematuria: Secondary | ICD-10-CM | POA: Diagnosis not present

## 2020-06-12 DIAGNOSIS — M25561 Pain in right knee: Secondary | ICD-10-CM | POA: Diagnosis not present

## 2020-06-12 DIAGNOSIS — R32 Unspecified urinary incontinence: Secondary | ICD-10-CM | POA: Diagnosis not present

## 2020-06-24 DIAGNOSIS — J45909 Unspecified asthma, uncomplicated: Secondary | ICD-10-CM | POA: Diagnosis not present

## 2020-06-25 DIAGNOSIS — Q791 Other congenital malformations of diaphragm: Secondary | ICD-10-CM | POA: Diagnosis not present

## 2020-06-25 DIAGNOSIS — J449 Chronic obstructive pulmonary disease, unspecified: Secondary | ICD-10-CM | POA: Diagnosis not present

## 2020-06-25 DIAGNOSIS — J9611 Chronic respiratory failure with hypoxia: Secondary | ICD-10-CM | POA: Diagnosis not present

## 2020-06-26 DIAGNOSIS — J45909 Unspecified asthma, uncomplicated: Secondary | ICD-10-CM | POA: Diagnosis not present

## 2020-07-01 DIAGNOSIS — J45909 Unspecified asthma, uncomplicated: Secondary | ICD-10-CM | POA: Diagnosis not present

## 2020-07-02 DIAGNOSIS — H2512 Age-related nuclear cataract, left eye: Secondary | ICD-10-CM | POA: Diagnosis not present

## 2020-07-08 DIAGNOSIS — J45909 Unspecified asthma, uncomplicated: Secondary | ICD-10-CM | POA: Diagnosis not present

## 2020-07-09 DIAGNOSIS — H903 Sensorineural hearing loss, bilateral: Secondary | ICD-10-CM | POA: Diagnosis not present

## 2020-07-09 DIAGNOSIS — H6123 Impacted cerumen, bilateral: Secondary | ICD-10-CM | POA: Diagnosis not present

## 2020-07-10 DIAGNOSIS — J45909 Unspecified asthma, uncomplicated: Secondary | ICD-10-CM | POA: Diagnosis not present

## 2020-07-16 DIAGNOSIS — R309 Painful micturition, unspecified: Secondary | ICD-10-CM | POA: Diagnosis not present

## 2020-07-22 DIAGNOSIS — M545 Low back pain, unspecified: Secondary | ICD-10-CM | POA: Diagnosis not present
